# Patient Record
Sex: Female | Born: 1950 | Race: White | Hispanic: No | Smoking: Never smoker
Health system: Southern US, Community
[De-identification: ages and names within clinical notes are randomized; demographics above are authoritative.]

## PROBLEM LIST (undated history)

## (undated) DIAGNOSIS — R0681 Apnea, not elsewhere classified: Secondary | ICD-10-CM

## (undated) DIAGNOSIS — G4730 Sleep apnea, unspecified: Secondary | ICD-10-CM

## (undated) DIAGNOSIS — J45909 Unspecified asthma, uncomplicated: Secondary | ICD-10-CM

## (undated) DIAGNOSIS — T753XXA Motion sickness, initial encounter: Secondary | ICD-10-CM

## (undated) DIAGNOSIS — R079 Chest pain, unspecified: Secondary | ICD-10-CM

## (undated) DIAGNOSIS — A6000 Herpesviral infection of urogenital system, unspecified: Secondary | ICD-10-CM

## (undated) DIAGNOSIS — I5189 Other ill-defined heart diseases: Secondary | ICD-10-CM

## (undated) DIAGNOSIS — Z9289 Personal history of other medical treatment: Secondary | ICD-10-CM

## (undated) DIAGNOSIS — E119 Type 2 diabetes mellitus without complications: Secondary | ICD-10-CM

## (undated) DIAGNOSIS — K219 Gastro-esophageal reflux disease without esophagitis: Secondary | ICD-10-CM

## (undated) DIAGNOSIS — N200 Calculus of kidney: Secondary | ICD-10-CM

## (undated) DIAGNOSIS — M1990 Unspecified osteoarthritis, unspecified site: Secondary | ICD-10-CM

## (undated) DIAGNOSIS — Z803 Family history of malignant neoplasm of breast: Secondary | ICD-10-CM

## (undated) DIAGNOSIS — I10 Essential (primary) hypertension: Secondary | ICD-10-CM

## (undated) DIAGNOSIS — H3530 Unspecified macular degeneration: Secondary | ICD-10-CM

## (undated) HISTORY — DX: Personal history of other medical treatment: Z92.89

## (undated) HISTORY — DX: Unspecified asthma, uncomplicated: J45.909

## (undated) HISTORY — DX: Family history of malignant neoplasm of breast: Z80.3

## (undated) HISTORY — DX: Other ill-defined heart diseases: I51.89

## (undated) HISTORY — DX: Calculus of kidney: N20.0

## (undated) HISTORY — DX: Apnea, not elsewhere classified: R06.81

## (undated) HISTORY — DX: Chest pain, unspecified: R07.9

## (undated) HISTORY — DX: Unspecified macular degeneration: H35.30

## (undated) HISTORY — DX: Herpesviral infection of urogenital system, unspecified: A60.00

## (undated) HISTORY — PX: CARPAL TUNNEL RELEASE: SHX101

---

## 1991-04-12 HISTORY — PX: LAPAROSCOPY: SHX197

## 1999-12-20 ENCOUNTER — Ambulatory Visit (HOSPITAL_BASED_OUTPATIENT_CLINIC_OR_DEPARTMENT_OTHER): Admission: RE | Admit: 1999-12-20 | Discharge: 1999-12-20 | Payer: Self-pay | Admitting: Family Medicine

## 2000-04-11 HISTORY — PX: CHOLECYSTECTOMY, LAPAROSCOPIC: SHX56

## 2002-04-11 HISTORY — PX: HYSTEROSCOPY: SHX211

## 2004-01-19 ENCOUNTER — Ambulatory Visit: Payer: Self-pay | Admitting: Internal Medicine

## 2004-01-29 ENCOUNTER — Ambulatory Visit: Payer: Self-pay | Admitting: Internal Medicine

## 2004-03-02 ENCOUNTER — Ambulatory Visit: Payer: Self-pay | Admitting: Internal Medicine

## 2004-04-07 ENCOUNTER — Ambulatory Visit: Payer: Self-pay

## 2004-06-17 ENCOUNTER — Ambulatory Visit: Payer: Self-pay | Admitting: Unknown Physician Specialty

## 2004-09-08 ENCOUNTER — Emergency Department: Payer: Self-pay | Admitting: Emergency Medicine

## 2005-07-28 ENCOUNTER — Ambulatory Visit: Payer: Self-pay | Admitting: Unknown Physician Specialty

## 2006-04-11 HISTORY — PX: HYSTEROSCOPY: SHX211

## 2006-04-15 ENCOUNTER — Ambulatory Visit: Payer: Self-pay | Admitting: Family Medicine

## 2006-08-03 ENCOUNTER — Ambulatory Visit: Payer: Self-pay | Admitting: Unknown Physician Specialty

## 2006-09-14 ENCOUNTER — Other Ambulatory Visit: Payer: Self-pay

## 2006-09-14 ENCOUNTER — Ambulatory Visit: Payer: Self-pay | Admitting: Unknown Physician Specialty

## 2006-09-19 ENCOUNTER — Ambulatory Visit: Payer: Self-pay | Admitting: Unknown Physician Specialty

## 2007-04-16 ENCOUNTER — Inpatient Hospital Stay: Payer: Self-pay | Admitting: Specialist

## 2007-04-16 ENCOUNTER — Other Ambulatory Visit: Payer: Self-pay

## 2007-04-18 ENCOUNTER — Encounter: Payer: Self-pay | Admitting: Cardiovascular Disease

## 2007-05-13 HISTORY — PX: CARDIAC CATHETERIZATION: SHX172

## 2007-08-06 ENCOUNTER — Ambulatory Visit: Payer: Self-pay | Admitting: Unknown Physician Specialty

## 2008-06-04 IMAGING — CR DG CHEST 1V PORT
1 series · 1 of 1 positions shown · non-contrast
Comparison: none

REASON FOR EXAM: Chest pain
COMMENTS:

[view not recorded]
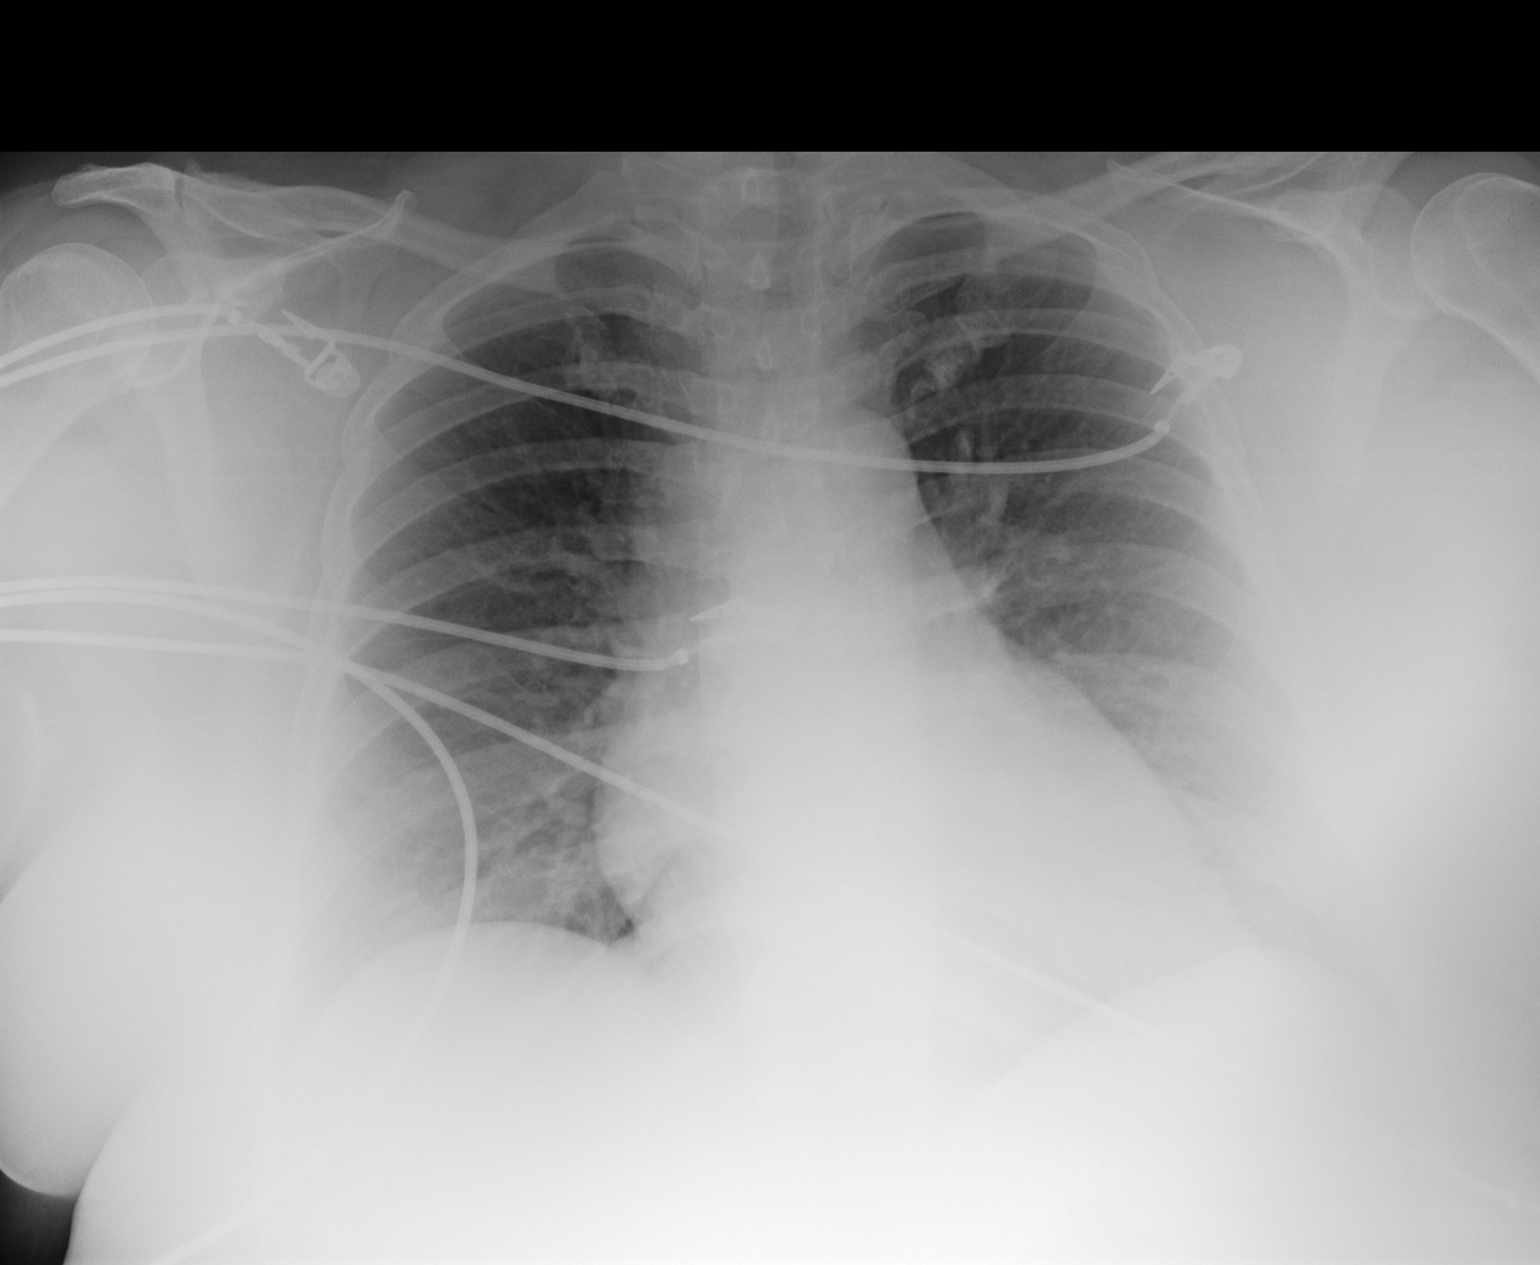

[1 of 1 positions shown; findings below may reference images not displayed]

PROCEDURE:     DXR - DXR PORTABLE CHEST SINGLE VIEW  - April 16, 2007  [DATE]

RESULT:     The lung fields are clear. No pneumonia, pneumothorax or pleural
effusion is seen. There is slight prominence of the pulmonary vascularity.
Mild pulmonary vascular congestion cannot be excluded. Monitoring electrodes
are present.
IMPRESSION: 1.  The lung fields are clear of infiltrate.
2.  There is slight prominence of the pulmonary vascularity suspicious for
mild pulmonary vascular congestion but with no interstitial or pulmonary
edema seen at this time.

## 2008-08-25 ENCOUNTER — Ambulatory Visit: Payer: Self-pay | Admitting: Family Medicine

## 2008-10-02 ENCOUNTER — Ambulatory Visit: Payer: Self-pay | Admitting: Unknown Physician Specialty

## 2009-10-05 ENCOUNTER — Ambulatory Visit: Payer: Self-pay | Admitting: Unknown Physician Specialty

## 2009-10-18 ENCOUNTER — Ambulatory Visit: Payer: Self-pay | Admitting: Family Medicine

## 2010-02-16 ENCOUNTER — Ambulatory Visit: Payer: Self-pay | Admitting: Urology

## 2010-04-30 ENCOUNTER — Ambulatory Visit: Payer: Self-pay

## 2010-11-11 ENCOUNTER — Ambulatory Visit: Payer: Self-pay | Admitting: Unknown Physician Specialty

## 2010-11-23 ENCOUNTER — Ambulatory Visit: Payer: Self-pay | Admitting: Urology

## 2011-01-12 ENCOUNTER — Ambulatory Visit: Payer: Self-pay | Admitting: Urology

## 2011-01-12 DIAGNOSIS — I10 Essential (primary) hypertension: Secondary | ICD-10-CM

## 2011-01-17 ENCOUNTER — Observation Stay: Payer: Self-pay | Admitting: Urology

## 2011-01-19 ENCOUNTER — Ambulatory Visit: Payer: Self-pay | Admitting: Urology

## 2011-01-20 ENCOUNTER — Ambulatory Visit: Payer: Self-pay | Admitting: Urology

## 2011-01-26 ENCOUNTER — Ambulatory Visit: Payer: Self-pay | Admitting: Urology

## 2011-02-03 ENCOUNTER — Ambulatory Visit: Payer: Self-pay | Admitting: Urology

## 2011-04-19 ENCOUNTER — Ambulatory Visit: Payer: Self-pay

## 2011-08-04 ENCOUNTER — Ambulatory Visit: Payer: Self-pay

## 2011-11-17 ENCOUNTER — Ambulatory Visit: Payer: Self-pay | Admitting: Obstetrics and Gynecology

## 2012-01-29 ENCOUNTER — Ambulatory Visit: Payer: Self-pay | Admitting: Internal Medicine

## 2013-04-17 ENCOUNTER — Ambulatory Visit: Payer: Self-pay | Admitting: Family Medicine

## 2013-04-24 ENCOUNTER — Ambulatory Visit: Payer: Self-pay | Admitting: Obstetrics and Gynecology

## 2013-04-30 ENCOUNTER — Ambulatory Visit: Payer: Self-pay | Admitting: Family Medicine

## 2013-11-01 DIAGNOSIS — G4730 Sleep apnea, unspecified: Secondary | ICD-10-CM | POA: Insufficient documentation

## 2013-11-01 DIAGNOSIS — E1169 Type 2 diabetes mellitus with other specified complication: Secondary | ICD-10-CM | POA: Insufficient documentation

## 2013-11-21 DIAGNOSIS — E1159 Type 2 diabetes mellitus with other circulatory complications: Secondary | ICD-10-CM | POA: Insufficient documentation

## 2013-12-30 ENCOUNTER — Ambulatory Visit: Payer: Self-pay | Admitting: Physician Assistant

## 2013-12-30 LAB — CBC WITH DIFFERENTIAL/PLATELET
Basophil #: 0.1 10*3/uL (ref 0.0–0.1)
Basophil %: 0.8 %
EOS ABS: 0.1 10*3/uL (ref 0.0–0.7)
Eosinophil %: 1.4 %
HCT: 42.1 % (ref 35.0–47.0)
HGB: 13.4 g/dL (ref 12.0–16.0)
Lymphocyte #: 3.1 10*3/uL (ref 1.0–3.6)
Lymphocyte %: 33.5 %
MCH: 27 pg (ref 26.0–34.0)
MCHC: 31.7 g/dL — AB (ref 32.0–36.0)
MCV: 85 fL (ref 80–100)
Monocyte #: 0.8 x10 3/mm (ref 0.2–0.9)
Monocyte %: 9.1 %
NEUTROS PCT: 55.2 %
Neutrophil #: 5 10*3/uL (ref 1.4–6.5)
PLATELETS: 338 10*3/uL (ref 150–440)
RBC: 4.95 10*6/uL (ref 3.80–5.20)
RDW: 13.5 % (ref 11.5–14.5)
WBC: 9.1 10*3/uL (ref 3.6–11.0)

## 2013-12-30 LAB — URINALYSIS, COMPLETE
BILIRUBIN, UR: NEGATIVE
BLOOD: NEGATIVE
Bacteria: NEGATIVE
GLUCOSE, UR: NEGATIVE
Ketone: NEGATIVE
Leukocyte Esterase: NEGATIVE
NITRITE: NEGATIVE
PH: 5.5 (ref 5.0–8.0)
Protein: NEGATIVE
Specific Gravity: 1.01 (ref 1.000–1.030)

## 2013-12-30 LAB — COMPREHENSIVE METABOLIC PANEL
ALT: 58 U/L
ANION GAP: 9 (ref 7–16)
Albumin: 3.4 g/dL (ref 3.4–5.0)
Alkaline Phosphatase: 55 U/L
BUN: 9 mg/dL (ref 7–18)
Bilirubin,Total: 0.3 mg/dL (ref 0.2–1.0)
CALCIUM: 9.6 mg/dL (ref 8.5–10.1)
CO2: 31 mmol/L (ref 21–32)
CREATININE: 0.78 mg/dL (ref 0.60–1.30)
Chloride: 99 mmol/L (ref 98–107)
EGFR (African American): >60
GLUCOSE: 170 mg/dL — AB (ref 65–99)
OSMOLALITY: 280 (ref 275–301)
Potassium: 3.7 mmol/L (ref 3.5–5.1)
SGOT(AST): 43 U/L — ABNORMAL HIGH (ref 15–37)
SODIUM: 139 mmol/L (ref 136–145)
Total Protein: 7.8 g/dL (ref 6.4–8.2)

## 2014-07-11 HISTORY — PX: ESOPHAGOGASTRODUODENOSCOPY ENDOSCOPY: SHX5814

## 2014-07-11 HISTORY — PX: COLONOSCOPY: SHX174

## 2014-07-25 ENCOUNTER — Ambulatory Visit
Admit: 2014-07-25 | Disposition: A | Discharge status: Home / Self Care | Payer: Self-pay | Attending: Gastroenterology | Admitting: Gastroenterology

## 2014-07-25 LAB — HM COLONOSCOPY

## 2015-02-13 ENCOUNTER — Other Ambulatory Visit: Payer: Self-pay | Admitting: Obstetrics and Gynecology

## 2015-03-04 ENCOUNTER — Other Ambulatory Visit: Payer: Self-pay | Admitting: Obstetrics and Gynecology

## 2015-03-04 DIAGNOSIS — Z803 Family history of malignant neoplasm of breast: Secondary | ICD-10-CM

## 2015-03-04 DIAGNOSIS — Z1231 Encounter for screening mammogram for malignant neoplasm of breast: Secondary | ICD-10-CM

## 2015-03-04 HISTORY — DX: Family history of malignant neoplasm of breast: Z80.3

## 2015-03-11 ENCOUNTER — Ambulatory Visit
Admission: RE | Admit: 2015-03-11 | Discharge: 2015-03-11 | Disposition: A | Discharge status: Home / Self Care | Payer: 59 | Source: Ambulatory Visit | Attending: Obstetrics and Gynecology | Admitting: Obstetrics and Gynecology

## 2015-03-11 DIAGNOSIS — Z1231 Encounter for screening mammogram for malignant neoplasm of breast: Secondary | ICD-10-CM | POA: Insufficient documentation

## 2015-03-11 LAB — HM MAMMOGRAPHY

## 2015-10-05 ENCOUNTER — Ambulatory Visit
Admission: EM | Admit: 2015-10-05 | Discharge: 2015-10-05 | Disposition: A | Discharge status: Home / Self Care | Payer: BLUE CROSS/BLUE SHIELD | Attending: Family Medicine | Admitting: Family Medicine

## 2015-10-05 ENCOUNTER — Encounter: Payer: Self-pay | Admitting: *Deleted

## 2015-10-05 DIAGNOSIS — T148 Other injury of unspecified body region: Secondary | ICD-10-CM

## 2015-10-05 DIAGNOSIS — W57XXXA Bitten or stung by nonvenomous insect and other nonvenomous arthropods, initial encounter: Secondary | ICD-10-CM

## 2015-10-05 DIAGNOSIS — Z23 Encounter for immunization: Secondary | ICD-10-CM

## 2015-10-05 HISTORY — DX: Essential (primary) hypertension: I10

## 2015-10-05 HISTORY — DX: Type 2 diabetes mellitus without complications: E11.9

## 2015-10-05 MED ORDER — CEPHALEXIN 500 MG PO CAPS: 500.0000 mg | ORAL_CAPSULE | Freq: Three times a day (TID) | ORAL | Status: DC

## 2015-10-05 MED ORDER — MUPIROCIN CALCIUM 2 % EX CREA: 1.0000 "application " | TOPICAL_CREAM | Freq: Three times a day (TID) | CUTANEOUS | Status: DC

## 2015-10-05 MED ORDER — TETANUS-DIPHTH-ACELL PERTUSSIS 5-2.5-18.5 LF-MCG/0.5 IM SUSP
0.5000 mL | Freq: Once | INTRAMUSCULAR | Status: AC
  Administered 2015-10-05: 0.5 mL via INTRAMUSCULAR

## 2015-10-05 NOTE — ED Notes (Signed)
Sudden onset right 2nd & 3rd toe pain while mowing today. Toes appear reddened and edematous.

## 2015-11-06 NOTE — ED Provider Notes (Addendum)
[facility]    CSN: [id number] Arrival date & time: 10/05/15  7939  First Provider Contact:  First MD Initiated Contact with Patient 10/05/15 1927        History   Chief Complaint Chief Complaint  Patient presents with  . Toe Pain    HPI RANDA FELTES is a 65 y.o. female.   65 yo female with a c/o 2nd and 3rd right toe pain, redness, and swelling after mowing in yard. Denies any fevers, chills.    The history is provided by the patient.  Toe Pain     Past Medical History:  Diagnosis Date  . Diabetes mellitus without complication (HCC)   . Hypertension     There are no active problems to display for this patient.   History reviewed. No pertinent surgical history.  OB History    No data available       Home Medications    Prior to Admission medications   Medication Sig Start Date End Date Taking? Authorizing Provider  aspirin EC 81 MG tablet Take 81 mg by mouth daily.   Yes Historical Provider, MD  canagliflozin (INVOKANA) 100 MG TABS tablet Take by mouth daily before breakfast.   Yes Historical Provider, MD  cetirizine (ZYRTEC) 10 MG tablet Take 10 mg by mouth daily.   Yes Historical Provider, MD  Cyanocobalamin (VITAMIN B 12 PO) Take by mouth.   Yes Historical Provider, MD  FLUoxetine (PROZAC) 10 MG capsule Take 10 mg by mouth daily.   Yes Historical Provider, MD  Liraglutide (VICTOZA [place]) Inject into the skin.   Yes Historical Provider, MD  lisinopril-hydrochlorothiazide (PRINZIDE,ZESTORETIC) 10-12.5 MG tablet Take 1 tablet by mouth daily.   Yes Historical Provider, MD  metFORMIN (GLUMETZA) 1000 MG (MOD) 24 hr tablet Take 1,000 mg by mouth daily with breakfast.   Yes Historical Provider, MD  Multiple Vitamins-Minerals (PRESERVISION/LUTEIN PO) Take by mouth.   Yes Historical Provider, MD  omeprazole (PRILOSEC) 20 MG capsule Take 20 mg by mouth daily.   Yes Historical Provider, MD  sertraline (ZOLOFT) 50 MG tablet Take 50 mg by mouth daily.    Yes Historical Provider, MD  cephALEXin (KEFLEX) 500 MG capsule Take 1 capsule (500 mg total) by mouth 3 (three) times daily. 10/05/15   Payton Mccallum, MD  mupirocin cream (BACTROBAN) 2 % Apply 1 application topically 3 (three) times daily. 10/05/15   Payton Mccallum, MD    Family History Family History  Problem Relation Age of Onset  . Breast cancer Sister 76  . Breast cancer Cousin 60    2 pat cousins    Social History Social History  Substance Use Topics  . Smoking status: Never Smoker  . Smokeless tobacco: Not on file  . Alcohol use No     Allergies   Blue dyes (parenteral); Ciprofloxacin; and Hydrocodone   Review of Systems Review of Systems   Physical Exam Triage Vital Signs ED Triage Vitals [10/05/15 1849]  Enc Vitals Group     BP 126/68     Pulse Rate 73     Resp 16     Temp 98.6 F (37 C)     Temp Source Oral     SpO2 95 %     Weight 226 lb (102.5 kg)     Height 5\' 4"  (1.626 m)     Head Circumference      Peak Flow      Pain Score      Pain Loc  Pain Edu?      Excl. in GC?    No data found.   Updated Vital Signs BP 126/68 (BP Location: Left Arm)   Pulse 73   Temp 98.6 F (37 C) (Oral)   Resp 16   Ht  (1.626 m)   Wt 226 lb (102.5 kg)   SpO2 95%   BMI 38.79 kg/m   Visual Acuity Right Eye Distance:   Left Eye Distance:   Bilateral Distance:    Right Eye Near:   Left Eye Near:    Bilateral Near:     Physical Exam  Constitutional: She appears well-developed and well-nourished. No distress.  Skin: She is not diaphoretic. There is erythema (blanchable to 2nd and 3rd toes on the right; as well as pinpoint puncture wounds).  Nursing note and vitals reviewed.    UC Treatments / Results  Labs (all labs ordered are listed, but only abnormal results are displayed) Labs Reviewed - No data to display  EKG  EKG Interpretation None       Radiology No results found.  Procedures Procedures (including critical care  time)  Medications Ordered in UC Medications  Tdap (BOOSTRIX) injection 0.5 mL (0.5 mLs Intramuscular Given 10/05/15 1952)     Initial Impression / Assessment and Plan / UC Course  I have reviewed the triage vital signs and the nursing notes.  Pertinent labs & imaging results that were available during my care of the patient were reviewed by me and considered in my medical decision making (see chart for details).  Clinical Course      Final Clinical Impressions(s) / UC Diagnoses   Final diagnoses:  Insect bite    New Prescriptions Discharge Medication List as of 10/05/2015  7:56 PM    START taking these medications   Details  cephALEXin (KEFLEX) 500 MG capsule Take 1 capsule (500 mg total) by mouth 3 (three) times daily., Starting 10/05/2015, Until Discontinued, Normal    mupirocin cream (BACTROBAN) 2 % Apply 1 application topically 3 (three) times daily., Starting 10/05/2015, Until Discontinued, Normal       1.  diagnosis reviewed with patient 2. rx as per orders above; reviewed possible side effects, interactions, risks and benefits; patient given tdap vaccine 3. Recommend supportive treatment with warm compresses 4. Follow-up prn if symptoms worsen or don't improve   Payton Mccallum, MD 11/06/15 1330    Payton Mccallum, MD 11/06/15 1332

## 2016-01-23 ENCOUNTER — Emergency Department: Payer: BLUE CROSS/BLUE SHIELD

## 2016-01-23 ENCOUNTER — Emergency Department
Admission: EM | Admit: 2016-01-23 | Discharge: 2016-01-23 | Disposition: A | Discharge status: Home / Self Care | Payer: BLUE CROSS/BLUE SHIELD | Attending: Emergency Medicine | Admitting: Emergency Medicine

## 2016-01-23 DIAGNOSIS — I10 Essential (primary) hypertension: Secondary | ICD-10-CM | POA: Insufficient documentation

## 2016-01-23 DIAGNOSIS — R079 Chest pain, unspecified: Secondary | ICD-10-CM

## 2016-01-23 DIAGNOSIS — Z79899 Other long term (current) drug therapy: Secondary | ICD-10-CM | POA: Diagnosis not present

## 2016-01-23 DIAGNOSIS — R0789 Other chest pain: Secondary | ICD-10-CM | POA: Diagnosis not present

## 2016-01-23 DIAGNOSIS — E119 Type 2 diabetes mellitus without complications: Secondary | ICD-10-CM | POA: Insufficient documentation

## 2016-01-23 DIAGNOSIS — Z7982 Long term (current) use of aspirin: Secondary | ICD-10-CM | POA: Insufficient documentation

## 2016-01-23 LAB — CBC
HCT: 38 % (ref 35.0–47.0)
Hemoglobin: 12.7 g/dL (ref 12.0–16.0)
MCH: 23.9 pg — ABNORMAL LOW (ref 26.0–34.0)
MCHC: 33.3 g/dL (ref 32.0–36.0)
MCV: 71.7 fL — ABNORMAL LOW (ref 80.0–100.0)
PLATELETS: 306 10*3/uL (ref 150–440)
RBC: 5.3 MIL/uL — ABNORMAL HIGH (ref 3.80–5.20)
RDW: 18.8 % — AB (ref 11.5–14.5)
WBC: 7.8 10*3/uL (ref 3.6–11.0)

## 2016-01-23 LAB — BASIC METABOLIC PANEL
Anion gap: 8 (ref 5–15)
BUN: 19 mg/dL (ref 6–20)
CO2: 28 mmol/L (ref 22–32)
CREATININE: 0.64 mg/dL (ref 0.44–1.00)
Calcium: 8.9 mg/dL (ref 8.9–10.3)
Chloride: 102 mmol/L (ref 101–111)
GFR calc Af Amer: >60 mL/min (ref >60)
GLUCOSE: 175 mg/dL — AB (ref 65–99)
Potassium: 3.9 mmol/L (ref 3.5–5.1)
SODIUM: 138 mmol/L (ref 135–145)

## 2016-01-23 LAB — TROPONIN I: Troponin I: <0.03 ng/mL (ref <0.03)

## 2016-01-23 MED ORDER — ASPIRIN 81 MG PO CHEW
324.0000 mg | CHEWABLE_TABLET | Freq: Once | ORAL | Status: AC
  Administered 2016-01-23: 324 mg via ORAL
  Filled 2016-01-23: qty 4

## 2016-01-23 NOTE — ED Triage Notes (Signed)
Pt arrives to ED via POV for c/o sub-sternal chest pain with radiation "between the shoulder blades" since 330 am. Pt denies N/V, denies SOB, weakness, diaphoresis, or dizziness. Pt reports cardiac h/x and previous hospital admission for chest pain "but they never found anything, except my heart looked good and there were no blockages. I remember that".

## 2016-01-23 NOTE — ED Provider Notes (Signed)
[facility] Emergency Department Provider Note  ____________________________________________   First MD Initiated Contact with Patient 01/23/16 [phone]     (approximate)  I have reviewed the triage vital signs and the nursing notes.   HISTORY  Chief Complaint Chest Pain    HPI Miranda Pollard is a 65 y.o. female his medical history includes morbid obesity, hypertension, and diabetes who presents for evaluation of acute onset severe left-sided chest pain dating radiating through to the back.  She reports that she was already up at 3:30 in the morning caring for her elderly parentswhen she suddenly developed pain.  It is better now but it was severe at the time and she describes it as sharp and stabbing as well as a faint pressure.  She denies shortness of breath, nausea, vomiting, abdominal pain, dysuria, fever/chills.  Nothing in particular made it better nor worse.  She reports that 5 or 6 years ago she was admitted for similar chest pain and had a cardiac catheterization which was unremarkable.  They told her at the time she had no blockages.  She saw Dr. Darrold Junker after her hospitalization but has not followed up with them since then.  She does have a regular primary care doctor.  She has never smoked tobacco and has no first-degree relatives who have had a heart attack.   Past Medical History:  Diagnosis Date  . Diabetes mellitus without complication (HCC)   . Hypertension     There are no active problems to display for this patient.   History reviewed. No pertinent surgical history.  Prior to Admission medications   Medication Sig Start Date End Date Taking? Authorizing Provider  aspirin EC 81 MG tablet Take 81 mg by mouth daily.   Yes Historical Provider, MD  canagliflozin (INVOKANA) 100 MG TABS tablet Take by mouth daily before breakfast.   Yes Historical Provider, MD  cetirizine (ZYRTEC) 10 MG tablet Take 10 mg by mouth daily.   Yes Historical  Provider, MD  Cyanocobalamin (VITAMIN B 12 PO) Take by mouth.   Yes Historical Provider, MD  Liraglutide (VICTOZA [place]) Inject into the skin.   Yes Historical Provider, MD  lisinopril-hydrochlorothiazide (PRINZIDE,ZESTORETIC) 10-12.5 MG tablet Take 1 tablet by mouth daily.   Yes Historical Provider, MD  Multiple Vitamins-Minerals (PRESERVISION/LUTEIN PO) Take by mouth.   Yes Historical Provider, MD  omeprazole (PRILOSEC) 20 MG capsule Take 20 mg by mouth daily.   Yes Historical Provider, MD  sertraline (ZOLOFT) 50 MG tablet Take 50 mg by mouth daily.   Yes Historical Provider, MD  albuterol (PROVENTIL HFA;VENTOLIN HFA) 108 (90 Base) MCG/ACT inhaler Inhale 2 puffs into the lungs every 6 (six) hours as needed for wheezing or shortness of breath.    Historical Provider, MD  cephALEXin (KEFLEX) 500 MG capsule Take 1 capsule (500 mg total) by mouth 3 (three) times daily. 10/05/15   Payton Mccallum, MD  FLUoxetine (PROZAC) 10 MG capsule Take 10 mg by mouth daily.    Historical Provider, MD  metFORMIN (GLUMETZA) 1000 MG (MOD) 24 hr tablet Take 1,000 mg by mouth daily with breakfast.    Historical Provider, MD  mupirocin cream (BACTROBAN) 2 % Apply 1 application topically 3 (three) times daily. 10/05/15   Payton Mccallum, MD    Allergies Blue dyes (parenteral); Ciprofloxacin; and Hydrocodone  Family History  Problem Relation Age of Onset  . Breast cancer Sister 39  . Breast cancer Cousin 60    2 pat cousins  Social History Social History  Substance Use Topics  . Smoking status: Never Smoker  . Smokeless tobacco: Never Used  . Alcohol use No    Review of Systems Constitutional: No fever/chills Eyes: No visual changes. ENT: No sore throat. Cardiovascular: +chest pain. Respiratory: Denies shortness of breath. Gastrointestinal: No abdominal pain.  No nausea, no vomiting.  No diarrhea.  No constipation. Genitourinary: Negative for dysuria. Musculoskeletal: Negative for back pain. Skin: Negative  for rash. Neurological: Negative for headaches, focal weakness or numbness.  10-point ROS otherwise negative.  ____________________________________________   PHYSICAL EXAM:  VITAL SIGNS: ED Triage Vitals  Enc Vitals Group     BP 01/23/16 0557 130/64     Pulse Rate 01/23/16 0557 74     Resp 01/23/16 0557 16     Temp 01/23/16 0557 98.5 F (36.9 C)     Temp Source 01/23/16 0557 Oral     SpO2 01/23/16 0557 96 %     Weight 01/23/16 0558 226 lb (102.5 kg)     Height 01/23/16 0558 5\' 4"  (1.626 m)     Head Circumference --      Peak Flow --      Pain Score 01/23/16 0559 10     Pain Loc --      Pain Edu? --      Excl. in GC? --     Constitutional: Alert and oriented. Well appearing and in no acute distress. Eyes: Conjunctivae are normal. PERRL. EOMI. Head: Atraumatic. Nose: No congestion/rhinnorhea. Mouth/Throat: Mucous membranes are moist.  Oropharynx non-erythematous. Neck: No stridor.  No meningeal signs.   Cardiovascular: Normal rate, regular rhythm. Good peripheral circulation. Grossly normal heart sounds. Respiratory: Normal respiratory effort.  No retractions. Lungs CTAB. Gastrointestinal: Morbid obesity.  Soft and nontender. No distention.  Musculoskeletal: No lower extremity tenderness nor edema. No gross deformities of extremities. Neurologic:  Normal speech and language. No gross focal neurologic deficits are appreciated.  Skin:  Skin is warm, dry and intact. No rash noted. Psychiatric: Mood and affect are normal. Speech and behavior are normal.  ____________________________________________   LABS (all labs ordered are listed, but only abnormal results are displayed)  Labs Reviewed  BASIC METABOLIC PANEL - Abnormal; Notable for the following:       Result Value   Glucose, Bld 175 (*)    All other components within normal limits  CBC - Abnormal; Notable for the following:    RBC 5.30 (*)    MCV 71.7 (*)    MCH 23.9 (*)    RDW 18.8 (*)    All other  components within normal limits  TROPONIN I  TROPONIN I   ____________________________________________  EKG  ED ECG REPORT I, Nalanie Winiecki, the attending physician, personally viewed and interpreted this ECG.  Date: 01/23/2016 EKG Time: 5:56 AM Rate: 70 Rhythm: normal sinus rhythm QRS Axis: normal Intervals: normal ST/T Wave abnormalities: normal Conduction Disturbances: none Narrative Interpretation: unremarkable  ____________________________________________  RADIOLOGY   Dg Chest 2 View  Result Date: 01/23/2016 CLINICAL DATA:  Substernal chest pain. EXAM: CHEST  2 VIEW COMPARISON:  04/16/2007 FINDINGS: The cardiomediastinal contours are normal. The lungs are clear. Pulmonary vasculature is normal. No consolidation, pleural effusion, or pneumothorax. No acute osseous abnormalities are seen. Degenerative change in the thoracic spine. IMPRESSION: No active cardiopulmonary disease. Electronically Signed   By: Rubye OaksMelanie  Ehinger M.D.   On: 01/23/2016 06:24    ____________________________________________   PROCEDURES  Procedure(s) performed:   Procedures   Critical Care  performed: No ____________________________________________   INITIAL IMPRESSION / ASSESSMENT AND PLAN / ED COURSE  Pertinent labs & imaging results that were available during my care of the patient were reviewed by me and considered in my medical decision making (see chart for details).  I ordered a full dose aspirin for the patient and she takes a regular daily baby aspirin.  She has a negative first troponin and a HEART score of 3 which is low risk.  It is also the weekend and I did tell the patient and her husband that no advanced testing will take place in the hospital.  She has inability to follow-up with cardiology within the next couple of days.  We will check a second troponin and if the troponin is negative then she is comfortable with plan to follow up as an outpatient.   Clinical Course    Comment By Time  Transferring ED care to Dr. Fanny Bien to follow up second troponin and reassess the patient. Loleta Rose, MD 10/14 [phone]    ____________________________________________  FINAL CLINICAL IMPRESSION(S) / ED DIAGNOSES  Final diagnoses:  Chest pain, unspecified type     MEDICATIONS GIVEN DURING THIS VISIT:  Medications  aspirin chewable tablet 324 mg (324 mg Oral Given 01/23/16 0643)     NEW OUTPATIENT MEDICATIONS STARTED DURING THIS VISIT:  Discharge Medication List as of 01/23/2016  9:50 AM      Discharge Medication List as of 01/23/2016  9:50 AM      Discharge Medication List as of 01/23/2016  9:50 AM       Note:  This document was prepared using Dragon voice recognition software and may include unintentional dictation errors.    Loleta Rose, MD 01/24/16 [phone]

## 2016-01-23 NOTE — Discharge Instructions (Signed)

## 2016-01-23 NOTE — ED Provider Notes (Signed)
Patient resting comfortable. Awake and alert and very pleasant. She is eating, reports no distress or ongoing chest discomfort. She understands careful return precautions and a plan of follow-up closely with Dr. Cassie FreerParachos on Monday.  2 troponins normal. Patient being discharged home  Return precautions and treatment recommendations and follow-up discussed with the patient who is agreeable with the plan.    Sharyn CreamerMark Quale, MD 01/23/16 [phone]

## 2016-03-29 ENCOUNTER — Other Ambulatory Visit: Payer: Self-pay | Admitting: Obstetrics and Gynecology

## 2016-05-02 ENCOUNTER — Encounter: Payer: Self-pay | Admitting: Obstetrics and Gynecology

## 2016-05-02 DIAGNOSIS — Z9289 Personal history of other medical treatment: Secondary | ICD-10-CM

## 2016-05-02 HISTORY — DX: Personal history of other medical treatment: Z92.89

## 2016-05-03 LAB — HM PAP SMEAR: HM Pap smear: NEGATIVE

## 2016-05-06 ENCOUNTER — Other Ambulatory Visit: Payer: Self-pay | Admitting: Obstetrics and Gynecology

## 2016-05-06 DIAGNOSIS — Z1231 Encounter for screening mammogram for malignant neoplasm of breast: Secondary | ICD-10-CM

## 2016-05-19 ENCOUNTER — Ambulatory Visit: Admission: RE | Admit: 2016-05-19 | Payer: BLUE CROSS/BLUE SHIELD | Source: Ambulatory Visit

## 2016-05-24 ENCOUNTER — Ambulatory Visit: Payer: BLUE CROSS/BLUE SHIELD | Attending: Obstetrics and Gynecology

## 2016-07-05 ENCOUNTER — Telehealth: Payer: Self-pay

## 2016-07-05 ENCOUNTER — Ambulatory Visit
Admission: RE | Admit: 2016-07-05 | Discharge: 2016-07-05 | Disposition: A | Discharge status: Home / Self Care | Payer: BLUE CROSS/BLUE SHIELD | Source: Ambulatory Visit | Attending: Obstetrics and Gynecology | Admitting: Obstetrics and Gynecology

## 2016-07-05 DIAGNOSIS — Z1231 Encounter for screening mammogram for malignant neoplasm of breast: Secondary | ICD-10-CM | POA: Diagnosis present

## 2016-07-05 NOTE — Telephone Encounter (Signed)
Pt no showed for her Myriad results in Feb. Please call and schedule this with SDJ at first available. Thank you.

## 2016-07-05 NOTE — Telephone Encounter (Signed)
Left voicemail for pt tocall back to be reschedule.

## 2016-07-07 NOTE — Telephone Encounter (Signed)
I called and Left message with Family to have patient call to be schedule.

## 2016-07-26 NOTE — Telephone Encounter (Signed)
Spoke with Miranda Pollard and schedule May 18 at 8:50 with Jean Rosenthal

## 2016-08-26 ENCOUNTER — Ambulatory Visit: Payer: Self-pay | Admitting: Obstetrics and Gynecology

## 2016-09-07 ENCOUNTER — Encounter: Payer: Self-pay | Admitting: Obstetrics and Gynecology

## 2016-09-07 ENCOUNTER — Ambulatory Visit (INDEPENDENT_AMBULATORY_CARE_PROVIDER_SITE_OTHER): Payer: BLUE CROSS/BLUE SHIELD | Admitting: Obstetrics and Gynecology

## 2016-09-07 DIAGNOSIS — Z803 Family history of malignant neoplasm of breast: Secondary | ICD-10-CM | POA: Diagnosis not present

## 2016-09-07 NOTE — Progress Notes (Signed)
Obstetrics & Gynecology Office Visit   Chief Complaint  Patient presents with  . Follow-up    Myriad Results    History of Present Illness: She is a 66 y.o. female , G1P0010 , for follow up from genetic testing. She had testing for [facility].   Patient had the BRCA 1&2 testing done, which was normal., Colaris testing done, which was normal., and My Risk Her Breast Cancer RiskSCore was 7.3% lifetime risk of developing breast cancer.  Tyrer Cuzik was 12.2% lifetime risk of developing breast cancer.    Review of Systems: ROS - negative  Past Medical History:  Past Medical History:  Diagnosis Date  . Apnea   . Asthma   . Calculus of kidney   . Diabetes mellitus without complication ([place])   . Family history of breast cancer 03/04/2015   MyRisk NEG  . Genital herpes   . History of Papanicolaou smear of cervix 05/02/2016   NIL/NEG  . Hypertension   . Hypertension   . Macular degeneration (senile) of retina     Past Surgical History:  Past Surgical History:  Procedure Laterality Date  . CARPAL TUNNEL RELEASE  1980's  . CHOLECYSTECTOMY, LAPAROSCOPIC  2002  . COLONOSCOPY  07/2014  . ESOPHAGOGASTRODUODENOSCOPY ENDOSCOPY  07/2014  . HYSTEROSCOPY  2004   cystic hyperplasia  . HYSTEROSCOPY  2008   postemopausa bleeding weakly proliferative endometrium on pathology  . LAPAROSCOPY  1993   Laparoscopy and laparotomy for "ovarian cysts and fibroid"    Gynecologic History: No LMP recorded. Patient is postmenopausal.  Obstetric History: G1P0010  Family History:  Family History  Problem Relation Age of Onset  . Breast cancer Sister 57  . Breast cancer Cousin 60       2 pat cousins  . Hypertension Mother   . Melanoma Mother 90  . Asthma Father   . Congestive Heart Failure Father   . COPD Father   . Diabetes Father   . Emphysema Father   . Hypertension Brother   . Sleep apnea Brother   . Colon cancer Maternal Grandmother     Social History:  Social History   Social  History  . Marital status: Married    Spouse name: N/A  . Number of children: N/A  . Years of education: N/A   Occupational History  . Not on file.   Social History Main Topics  . Smoking status: Never Smoker  . Smokeless tobacco: Never Used  . Alcohol use No  . Drug use: No  . Sexual activity: Not Currently    Birth control/ protection: Post-menopausal   Other Topics Concern  . Not on file   Social History Narrative  . No narrative on file    Allergies:  Allergies  Allergen Reactions  . Blue Dyes (Parenteral) Anaphylaxis  . Ciprofloxacin Nausea And Vomiting  . Hydrocodone Palpitations    Medications: Prior to Admission medications   Medication Sig Start Date End Date Taking? Authorizing Provider  acetaminophen (TYLENOL) 650 MG CR tablet Take by mouth.   Yes [provider]  albuterol (PROVENTIL HFA;VENTOLIN HFA) 108 (90 Base) MCG/ACT inhaler Inhale 2 puffs into the lungs every 6 (six) hours as needed for wheezing or shortness of breath.   Yes [provider]  aspirin EC 81 MG tablet Take 81 mg by mouth daily.   Yes [provider]  bevacizumab (AVASTIN) 1.25 mg/0.1 mL SOLN Inject into the vein.   Yes [provider]  canagliflozin (INVOKANA) 100 MG TABS  tablet Take by mouth daily before breakfast.   Yes [provider]  cetirizine (ZYRTEC) 10 MG tablet Take 10 mg by mouth daily.   Yes [provider]  Cyanocobalamin (VITAMIN B 12 PO) Take by mouth.   Yes [provider]  FLUoxetine (PROZAC) 10 MG capsule Take 10 mg by mouth daily.   Yes [provider]  insulin lispro (HUMALOG KWIKPEN) 100 UNIT/ML KiwkPen Humalog KwikPen (U-100) Insulin 100 unit/mL subcutaneous   3 units 3 times a day by sub-q route.   Yes [provider]  Insulin Pen Needle (FIFTY50 PEN NEEDLES) 32G X 4 MM MISC Use 1 each once daily. 08/10/16  Yes [provider]  liraglutide (VICTOZA) 18 MG/3ML SOPN Victoza 0.6  mg/0.1 mL (18 mg/3 mL) subcutaneous pen injector   1.8 mL every day by sub-q route.   Yes [provider]  lisinopril-hydrochlorothiazide (PRINZIDE,ZESTORETIC) 10-12.5 MG tablet Take 1 tablet by mouth daily.   Yes [provider]  metFORMIN (GLUMETZA) 1000 MG (MOD) 24 hr tablet Take 1,000 mg by mouth daily with breakfast.   Yes [provider]  Multiple Vitamins-Minerals (ICAPS) CAPS Take by mouth.   Yes [provider]  Multiple Vitamins-Minerals (PRESERVISION/LUTEIN PO) Take by mouth.   Yes [provider]  mupirocin cream (BACTROBAN) 2 % Apply 1 application topically 3 (three) times daily. 10/05/15  Yes Norval Gable, MD  omeprazole (PRILOSEC) 20 MG capsule Take 20 mg by mouth daily.   Yes [provider]  omeprazole (PRILOSEC) 20 MG capsule omeprazole 20 mg tablet,delayed release   1 tablet every day by oral route.   Yes [provider]  sertraline (ZOLOFT) 50 MG tablet Take 50 mg by mouth daily.   Yes [provider]  cephALEXin (KEFLEX) 500 MG capsule Take 1 capsule (500 mg total) by mouth 3 (three) times daily. Patient not taking: Reported on 09/07/2016 10/05/15   Norval Gable, MD  FLUoxetine (PROZAC) 10 MG capsule fluoxetine 10 mg capsule   1 capsule every day by oral route.    [provider]    Physical Exam BP 134/84   Ht '5\' 5"'$  (1.651 m)   Wt 234 lb (106.1 kg)   BMI 38.94 kg/m  No LMP recorded. Patient is postmenopausal. Physical Exam  NAD   Assessment: 66 y.o. G1P0010 here for genetic screening results with family history of breast cancer  Plan: Patient is reassured by these results, and is counseled that this does not reduce her risk to zero. She understands that there is still a baseline risk for cancer of the breast (1:8 if lives beyond age 28) and ovary (which is less than one percent for the general population). She understands that continued surveillance of symptoms and regular exams and  mammograms are recommended.   She understands that continued surveillance of symptoms and regular examinations and colon screening is recommended.   Patient elects to accept the reassurance given today and continue with routine surveillance at this time.  She understands that I will arrange a discussion with a genetic counselor, if she has any further questions.  30 minutes spent in face to face discussion with > 50% spent in counseling and management of her genetic test results for her family history of  Breast cancer.   Prentice Docker, MD 09/07/2016 1:05 PM

## 2016-09-20 ENCOUNTER — Ambulatory Visit
Admission: EM | Admit: 2016-09-20 | Discharge: 2016-09-20 | Disposition: A | Discharge status: Home / Self Care | Payer: BLUE CROSS/BLUE SHIELD

## 2016-09-20 ENCOUNTER — Ambulatory Visit (INDEPENDENT_AMBULATORY_CARE_PROVIDER_SITE_OTHER): Payer: BLUE CROSS/BLUE SHIELD

## 2016-09-20 DIAGNOSIS — S46912A Strain of unspecified muscle, fascia and tendon at shoulder and upper arm level, left arm, initial encounter: Secondary | ICD-10-CM | POA: Diagnosis not present

## 2016-09-20 MED ORDER — CYCLOBENZAPRINE HCL 10 MG PO TABS: 10.0000 mg | ORAL_TABLET | Freq: Every day | ORAL | 0 refills | Status: DC

## 2016-09-20 MED ORDER — TRAMADOL HCL 50 MG PO TABS: ORAL_TABLET | ORAL | 0 refills | Status: DC
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
## 2016-09-20 NOTE — Discharge Instructions (Signed)
Rest, ibuprofen, tylenol as needed

## 2016-11-05 ENCOUNTER — Ambulatory Visit
Admission: EM | Admit: 2016-11-05 | Discharge: 2016-11-05 | Disposition: A | Discharge status: Home / Self Care | Payer: BLUE CROSS/BLUE SHIELD | Attending: Family | Admitting: Family

## 2016-11-05 ENCOUNTER — Encounter: Payer: Self-pay | Admitting: Gynecology

## 2016-11-05 DIAGNOSIS — L03031 Cellulitis of right toe: Secondary | ICD-10-CM

## 2016-11-05 DIAGNOSIS — W208XXA Other cause of strike by thrown, projected or falling object, initial encounter: Secondary | ICD-10-CM | POA: Diagnosis not present

## 2016-11-05 DIAGNOSIS — M79674 Pain in right toe(s): Secondary | ICD-10-CM | POA: Diagnosis not present

## 2016-11-05 MED ORDER — MUPIROCIN 2 % EX OINT: 1.0000 "application " | TOPICAL_OINTMENT | Freq: Two times a day (BID) | CUTANEOUS | 0 refills | Status: DC

## 2016-11-05 NOTE — Discharge Instructions (Signed)
Epson soaks. Since you are diabetic, please keep your blood sugars under good control and stay very vigilant regarding signs of infection.   Start antibiotic ointment.   Please follow up with her doctor or podiatry if symptoms persist and certainly come back for reevaluation if signs of infection  ( redness, pain, drainage) worsen over the weekend.

## 2016-11-05 NOTE — ED Provider Notes (Signed)
CSN: [id number]     Arrival date & time 11/05/16  [id number] History   First MD Initiated Contact with Patient 11/05/16 1005     Chief Complaint  Patient presents with  . Foot Pain   (Consider location/radiation/quality/duration/timing/severity/associated sxs/prior Treatment) Chief complaint right toe pain after dropping soda can on it 5 days, waxing and waning.  Thought it was getting better this week and then husband touched toe last night, noticed pain, and thought redness had gotten worse.   Describes it as red, painful, throbbing. No purulent discharge, fever, chills, N, vomiting.  Tylenol arthritis with some relief.   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 Apnea   . Asthma   . Calculus of kidney   . Diabetes mellitus without complication (HCC)   . Family history of breast cancer 03/04/2015   MyRisk NEG  . Genital herpes   . History of Papanicolaou smear of cervix 05/02/2016   NIL/NEG  . Hypertension   . Hypertension   . Macular degeneration (senile) of retina    Past Surgical History:  Procedure Laterality Date  . CARPAL TUNNEL RELEASE  1980's  . CHOLECYSTECTOMY, LAPAROSCOPIC  2002  . COLONOSCOPY  07/2014  . ESOPHAGOGASTRODUODENOSCOPY ENDOSCOPY  07/2014  . HYSTEROSCOPY  2004   cystic hyperplasia  . HYSTEROSCOPY  2008   postemopausa bleeding weakly proliferative endometrium on pathology  . LAPAROSCOPY  1993   Laparoscopy and laparotomy for "ovarian cysts and fibroid"   Family History  Problem Relation Age of Onset  . Breast cancer Sister 4739  . Breast cancer Cousin 60       2 pat cousins  . Hypertension Mother   . Melanoma Mother 1178  . Asthma Father   . Congestive Heart Failure Father   . COPD Father   . Diabetes Father   . Emphysema Father   . Hypertension Brother   . Sleep apnea Brother    . Colon cancer Maternal Grandmother    Social History  Substance Use Topics  . Smoking status: Never Smoker  . Smokeless tobacco: Never Used  . Alcohol use No   OB History    Gravida Para Term Preterm AB Living   1       1     SAB TAB Ectopic Multiple Live Births   1       0     Review of Systems  Constitutional: Negative for chills and fever.  Respiratory: Negative for cough.   Cardiovascular: Negative for chest pain and palpitations.  Gastrointestinal: Negative for nausea and vomiting.  Musculoskeletal: Negative for joint swelling.  Skin: Positive for wound.    Allergies  Blue dyes (parenteral); Ciprofloxacin; and Hydrocodone  Home Medications   Prior to Admission medications   Medication Sig Start Date End Date Taking? Authorizing Provider  acetaminophen (TYLENOL) 650 MG CR tablet Take by mouth.   Yes [provider]  albuterol (PROVENTIL HFA;VENTOLIN HFA) 108 (90 Base) MCG/ACT inhaler Inhale 2 puffs into the lungs every 6 (six) hours as needed for wheezing or shortness of breath.   Yes [provider]  aspirin 81 MG chewable tablet Chew by mouth daily.   Yes [provider]  aspirin EC 81 MG tablet Take 81 mg by mouth daily.   Yes [provider]  bevacizumab (AVASTIN) 1.25 mg/0.1 mL SOLN Inject into the vein.   Yes [provider]  canagliflozin (INVOKANA) 100 MG TABS tablet Take by mouth daily before breakfast.   Yes [provider]  cetirizine (ZYRTEC) 10 MG tablet Take 10 mg by mouth daily.   Yes [provider]  Cyanocobalamin (VITAMIN B 12 PO) Take by mouth.   Yes [provider]  cyclobenzaprine (FLEXERIL) 10 MG tablet Take 1 tablet (10 mg total) by mouth at bedtime. 09/20/16  Yes Payton Mccallumonty, [place], MD  liraglutide (VICTOZA) 18 MG/3ML SOPN Victoza 0.6 mg/0.1 mL (18 mg/3 mL) subcutaneous pen injector   1.8 mL every day by sub-q route.   Yes [provider]  lisinopril-hydrochlorothiazide  (PRINZIDE,ZESTORETIC) 10-12.5 MG tablet Take 1 tablet by mouth daily.   Yes [provider]  metFORMIN (GLUMETZA) 1000 MG (MOD) 24 hr tablet Take 1,000 mg by mouth daily with breakfast.   Yes [provider]  Multiple Vitamins-Minerals (ICAPS) CAPS Take by mouth.   Yes [provider]  Multiple Vitamins-Minerals (PRESERVISION/LUTEIN PO) Take by mouth.   Yes [provider]  omeprazole (PRILOSEC) 20 MG capsule Take 20 mg by mouth daily.   Yes [provider]  omeprazole (PRILOSEC) 20 MG capsule omeprazole 20 mg tablet,delayed release   1 tablet every day by oral route.   Yes [provider]  sertraline (ZOLOFT) 50 MG tablet Take 50 mg by mouth daily.   Yes [provider]  traMADol (ULTRAM) 50 MG tablet 1-2 tabs po qd prn 09/20/16  Yes Conty, [place], MD  mupirocin ointment (BACTROBAN) 2 % Place 1 application into the nose 2 (two) times daily. 11/05/16   Allegra GranaArnett, Constance Hackenberg G, FNP   Meds Ordered and Administered this Visit  Medications - No data to display  BP 123/67 (BP Location: Left Arm)   Temp 98.6 F (37 C) (Oral)   Resp 16   Ht 5\' 5"  (1.651 m)   Wt 234 lb (106.1 kg)   SpO2 97%   BMI 38.94 kg/m  No data found.   Physical Exam  Constitutional: She appears well-developed and well-nourished.  Eyes: Conjunctivae are normal.  Cardiovascular: Normal rate, regular rhythm, normal heart sounds and normal pulses.   Pulmonary/Chest: Effort normal and breath sounds normal. She has no wheezes. She has no rhonchi. She has no rales.  Musculoskeletal:  Right great toe paronychia noted medial side. Consent provided for incision and drainage. Declines any pain medication for procedure. Area prepped with Betadine. Small pinpoint hole made with #[id number]. Slight pressure with gauze applied and serous discharge expressed. Wound culture obtained. Patient tolerated procedure well. Bleeding controlled with pressure. Nurse covered in bandage.   Neurological: She is alert.  Skin: Skin is warm and dry.  Psychiatric: She has a normal mood and affect. Her speech is normal and behavior is normal. Thought content normal.  Vitals reviewed.   Urgent Care Course     Procedures (including critical care time)  Labs Review Labs Reviewed  AEROBIC CULTURE (SUPERFICIAL SPECIMEN)    Imaging Review No results found.       MDM   1. Paronychia of great toe of right foot    Afebrile. No systemic features. Pending wound culture. Patient tolerated incision drainage quite well. She preferred to start a topical antibiotic due to blue dye allergy ( anaphylaxis)  versus oral antibiotic which I had offered for empiric coverage. She states she's not had allergies to any ointments.  Encourage a follow-up  podiatrist as toe nail may come off. Epsom soaks.  Return precautions given.     Allegra Grana, FNP 11/05/16 1054

## 2016-11-05 NOTE — ED Triage Notes (Signed)
Patient c/o drop a soda can on right big toe. Patient with right big toe pain and redness.

## 2016-11-08 LAB — AEROBIC CULTURE W GRAM STAIN (SUPERFICIAL SPECIMEN)
Culture: NORMAL
Special Requests: NORMAL

## 2016-11-08 LAB — AEROBIC CULTURE  (SUPERFICIAL SPECIMEN)

## 2016-11-25 ENCOUNTER — Telehealth: Payer: Self-pay | Admitting: Emergency Medicine

## 2016-11-25 NOTE — Telephone Encounter (Signed)
Patient notified of her wound culture result.  Patient states that she still has some drainage from around her toe nail.  Patient to continue with the ointment. Patient was advised to follow-up here or with PCP if her symptoms do not improve or worsen.  Patient verbalized understanding.

## 2017-05-05 ENCOUNTER — Encounter: Payer: Self-pay | Admitting: *Deleted

## 2017-05-05 ENCOUNTER — Ambulatory Visit
Admission: EM | Admit: 2017-05-05 | Discharge: 2017-05-05 | Disposition: A | Discharge status: Home / Self Care | Payer: BLUE CROSS/BLUE SHIELD | Attending: Emergency Medicine | Admitting: Emergency Medicine

## 2017-05-05 ENCOUNTER — Ambulatory Visit (INDEPENDENT_AMBULATORY_CARE_PROVIDER_SITE_OTHER): Payer: BLUE CROSS/BLUE SHIELD

## 2017-05-05 DIAGNOSIS — R05 Cough: Secondary | ICD-10-CM | POA: Diagnosis not present

## 2017-05-05 DIAGNOSIS — R0981 Nasal congestion: Secondary | ICD-10-CM | POA: Diagnosis not present

## 2017-05-05 DIAGNOSIS — J09X2 Influenza due to identified novel influenza A virus with other respiratory manifestations: Secondary | ICD-10-CM

## 2017-05-05 DIAGNOSIS — R509 Fever, unspecified: Secondary | ICD-10-CM

## 2017-05-05 DIAGNOSIS — J101 Influenza due to other identified influenza virus with other respiratory manifestations: Secondary | ICD-10-CM

## 2017-05-05 LAB — RAPID INFLUENZA A&B ANTIGENS (ARMC ONLY): INFLUENZA A (ARMC): POSITIVE — AB

## 2017-05-05 LAB — RAPID INFLUENZA A&B ANTIGENS: Influenza B (ARMC): NEGATIVE

## 2017-05-05 MED ORDER — ALBUTEROL SULFATE HFA 108 (90 BASE) MCG/ACT IN AERS: 2.0000 | INHALATION_SPRAY | RESPIRATORY_TRACT | 0 refills | Status: DC | PRN

## 2017-05-05 MED ORDER — OSELTAMIVIR PHOSPHATE 75 MG PO CAPS: 75.0000 mg | ORAL_CAPSULE | Freq: Two times a day (BID) | ORAL | 0 refills | Status: DC

## 2017-05-05 MED ORDER — AEROCHAMBER PLUS MISC: 2 refills | Status: DC

## 2017-05-05 MED ORDER — BENZONATATE 200 MG PO CAPS: 200.0000 mg | ORAL_CAPSULE | Freq: Three times a day (TID) | ORAL | 0 refills | Status: DC | PRN

## 2017-05-05 MED ORDER — FLUTICASONE PROPIONATE 50 MCG/ACT NA SUSP: 2.0000 | Freq: Every day | NASAL | 0 refills | Status: AC

## 2017-05-05 NOTE — Discharge Instructions (Signed)
2 puffs from your albuterol inhaler every 4-6 hours as needed for coughing, wheezing, shortness of breath, chest pain.  Finish the Tamiflu.  Tessalon will help with the cough.  Start some saline nasal irrigation with a Lloyd HugerNeil med sinus rinse.  Flonase will help with the nasal congestion and the cough.

## 2017-05-05 NOTE — ED Provider Notes (Signed)
HPI  SUBJECTIVE:  Miranda Pollard is a 67 y.o. female who presents with 2 days of nasal congestion, rhinorrhea, postnasal drip, feeling feverish, but has not taken her temperature at home.  She reports a cough productive of clear mucus.  States that she is unable to sleep at night secondary to the cough.  She states that her ribs are sore on the right and that she has diffuse muscular soreness along her abdomen from coughing.  She denies any other abdominal pain.  She denies sinus pain or pressure, upper dental pain.  No body aches, headaches, sore throat, wheezing, chest pain.  No shortness of breath, dyspnea on exertion, sore throat.  She got a flu shot this year.  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  No antibiotics in the past month.  No antipyretic in the past 6-8 hours.  She tried lying on her right side, a home remedy involving rye whiskey and elevating the head of her bed.  She states that the whiskey slows her cough down.  No aggravating factors.  She has a past medical history of asthma, diabetes, hypertension, obstructive sleep apnea.  States her glucose has been running within normal limits for her.  PMD: Dr. Maryjane Hurter.   Past Medical History:  Diagnosis Date  . Apnea   . Asthma   . Calculus of kidney   . Diabetes mellitus without complication (HCC)   . Family history of breast cancer 03/04/2015   MyRisk NEG  . Genital herpes   . History of Papanicolaou smear of cervix 05/02/2016   NIL/NEG  . Hypertension   . Hypertension   . Macular degeneration (senile) of retina     Past Surgical History:  Procedure Laterality Date  . CARPAL TUNNEL RELEASE  1980's  . CHOLECYSTECTOMY, LAPAROSCOPIC  2002  . COLONOSCOPY  07/2014  . ESOPHAGOGASTRODUODENOSCOPY ENDOSCOPY  07/2014  . HYSTEROSCOPY  2004   cystic hyperplasia  . HYSTEROSCOPY  2008   postemopausa bleeding weakly proliferative endometrium on pathology  . LAPAROSCOPY  1993   Laparoscopy and laparotomy for "ovarian cysts and fibroid"    Family History  Problem Relation Age of Onset  . Breast cancer Sister 62  . Breast cancer Cousin 60       2 pat cousins  . Hypertension Mother   . Melanoma Mother 28  . Asthma Father   . Congestive Heart Failure Father   . COPD Father   . Diabetes Father   . Emphysema Father   . Hypertension Brother   . Sleep apnea Brother   . Colon cancer Maternal Grandmother     Social History   Tobacco Use  . Smoking status: Never Smoker  . Smokeless tobacco: Never Used  Substance Use Topics  . Alcohol use: No  . Drug use: No    No current facility-administered medications for this encounter.   Current Outpatient Medications:  .  acetaminophen (TYLENOL) 650 MG CR tablet, Take by mouth., Disp: , Rfl:  .  aspirin 81 MG chewable tablet, Chew by mouth daily., Disp: , Rfl:  .  aspirin EC 81 MG tablet, Take 81 mg by mouth daily., Disp: , Rfl:  .  bevacizumab (AVASTIN) 1.25 mg/0.1 mL SOLN, Inject into the vein., Disp: , Rfl:  .  cetirizine (ZYRTEC) 10 MG tablet, Take 10 mg by mouth daily., Disp: , Rfl:  .  Cyanocobalamin (VITAMIN B 12 PO), Take by mouth., Disp: , Rfl:  .  liraglutide (VICTOZA) 18 MG/3ML SOPN, Victoza 0.6 mg/0.1 mL (18 mg/3 mL) subcutaneous pen injector   1.8 mL every day by sub-q route., Disp: , Rfl:  .  lisinopril (PRINIVIL,ZESTRIL) 20 MG tablet, Take 20 mg by mouth daily., Disp: , Rfl:  .  metFORMIN (GLUMETZA) 1000 MG (MOD) 24 hr tablet, Take 1,000 mg by mouth daily with breakfast., Disp: , Rfl:  .  Multiple Vitamins-Minerals (ICAPS) CAPS, Take by mouth., Disp: , Rfl:  .  Multiple Vitamins-Minerals (PRESERVISION/LUTEIN PO), Take by mouth., Disp: , Rfl:  .  omeprazole (PRILOSEC) 20 MG capsule, Take 20 mg by mouth daily., Disp: , Rfl:  .  omeprazole (PRILOSEC) 20 MG capsule, omeprazole 20 mg tablet,delayed release   1 tablet every day by oral route., Disp: , Rfl:  .  sertraline (ZOLOFT) 50 MG tablet, Take 50 mg by  mouth daily., Disp: , Rfl:  .  albuterol (PROVENTIL HFA;VENTOLIN HFA) 108 (90 Base) MCG/ACT inhaler, Inhale 2 puffs into the lungs every 4 (four) hours as needed for wheezing or shortness of breath., Disp: 1 Inhaler, Rfl: 0 .  benzonatate (TESSALON) 200 MG capsule, Take 1 capsule (200 mg total) by mouth 3 (three) times daily as needed for cough., Disp: 30 capsule, Rfl: 0 .  fluticasone (FLONASE) 50 MCG/ACT nasal spray, Place 2 sprays into both nostrils daily., Disp: 16 g, Rfl: 0 .  oseltamivir (TAMIFLU) 75 MG capsule, Take 1 capsule (75 mg total) by mouth 2 (two) times daily. X 5 days, Disp: 10 capsule, Rfl: 0 .  Spacer/Aero-Holding Chambers (AEROCHAMBER PLUS) inhaler, Use as instructed, Disp: 1 each, Rfl: 2  Allergies  Allergen Reactions  . Blue Dyes (Parenteral) Anaphylaxis  . Ciprofloxacin Nausea And Vomiting  . Hydrocodone Palpitations     ROS  As noted in HPI.   Physical Exam  BP 114/64 (BP Location: Left Arm)   Pulse 80   Temp 99.5 F (37.5 C) (Oral) Comment: after Tylenol (patient took her own medication)  Resp 16   Ht 5\' 5"  (1.651 m)   Wt 220 lb (99.8 kg)   SpO2 98%   BMI 36.61 kg/m   Constitutional: Well developed, well nourished, no acute distress Eyes:  EOMI, conjunctiva normal bilaterally HENT: Normocephalic, atraumatic,mucus membranes moist.  Positive clear rhinorrhea.  Erythematous, but not swollen turbinates.  No sinus tenderness.  Normal oropharynx.  No postnasal drip, cobblestoning. Respiratory: Normal inspiratory effort, lungs clear bilaterally.  Positive tenderness right lower rib area.   Cardiovascular: Normal rate regular rhythm, no murmurs, rubs, gallops GI: Obese, soft, nontender, nondistended.  Negative Murphy, negative McBurney. skin: No rash, skin intact Musculoskeletal: no deformities Neurologic: Alert & oriented x 3, no focal neuro deficits Psychiatric: Speech and behavior appropriate   ED Course   Medications - No data to display  Orders  Placed This Encounter  Procedures  . Rapid Influenza A&B Antigens ([facility] only)    Standing Status:   Standing    Number of Occurrences:   1  . DG Chest 2 View    Standing Status:   Standing    Number of Occurrences:   1    Order Specific Question:   Reason for Exam (SYMPTOM  OR DIAGNOSIS REQUIRED)    Answer:   r/o PNA esp RLL  . Droplet precaution    Standing Status:   Standing    Number of Occurrences:   1    Results for orders placed or performed during the hospital encounter of 05/05/17 (from the past 24  hour(s))  Rapid Influenza A&B Antigens ([facility] only)     Status: Abnormal   Collection Time: 05/05/17 10:19 AM  Result Value Ref Range   Influenza A ([facility]) POSITIVE (A) NEGATIVE   Influenza B ([facility]) NEGATIVE NEGATIVE   Dg Chest 2 View  Result Date: 05/05/2017 CLINICAL DATA:  Cough, fever, congestion EXAM: CHEST  2 VIEW COMPARISON:  01/23/2016 FINDINGS: Heart and mediastinal contours are within normal limits. No focal opacities or effusions. No acute bony abnormality. IMPRESSION: No active cardiopulmonary disease. Electronically Signed   By: Charlett NoseKevin  Dover M.D.   On: 05/05/2017 10:38    ED Clinical Impression  Influenza A   ED Assessment/Plan  Patient took 975 mg of Tylenol that she had in her purse for the fever.  Will recheck temp.  Repeat temp 99.5.  Checking flu because this would change management and a chest x-ray due to the focal tenderness in the right lower rib area.  Doubt intra-abdominal process.  Flu a positive. Reviewed imaging independently.  No pneumonia.  See radiology report for details.  Home with Tamiflu, Tessalon, Flonase, saline nasal irrigation.  We will refill albuterol inhaler pacer.  2 puffs every 4-6 hours as needed for coughing, wheezing, shortness of breath.  Follow-up with primary care physician in 5 days if not better, to the ER if she gets worse.  Discussed labs, imaging, MDM, plan and followup with patient. Discussed sn/sx that should prompt  return to the ED. patient agrees with plan.   Meds ordered this encounter  Medications  . albuterol (PROVENTIL HFA;VENTOLIN HFA) 108 (90 Base) MCG/ACT inhaler    Sig: Inhale 2 puffs into the lungs every 4 (four) hours as needed for wheezing or shortness of breath.    Dispense:  1 Inhaler    Refill:  0  . Spacer/Aero-Holding Chambers (AEROCHAMBER PLUS) inhaler    Sig: Use as instructed    Dispense:  1 each    Refill:  2  . benzonatate (TESSALON) 200 MG capsule    Sig: Take 1 capsule (200 mg total) by mouth 3 (three) times daily as needed for cough.    Dispense:  30 capsule    Refill:  0  . oseltamivir (TAMIFLU) 75 MG capsule    Sig: Take 1 capsule (75 mg total) by mouth 2 (two) times daily. X 5 days    Dispense:  10 capsule    Refill:  0  . fluticasone (FLONASE) 50 MCG/ACT nasal spray    Sig: Place 2 sprays into both nostrils daily.    Dispense:  16 g    Refill:  0    *This clinic note was created using Scientist, clinical (histocompatibility and immunogenetics)Dragon dictation software. Therefore, there may be occasional mistakes despite careful proofreading.   ?   Domenick GongMortenson, Ofilia Rayon, MD 05/05/17 [phone]

## 2017-05-05 NOTE — ED Triage Notes (Signed)
Runny nose, productive cough- clear, head and chest congestion, chest and abdominal soreness from coughing, x3 days.

## 2017-06-22 ENCOUNTER — Emergency Department: Payer: Worker's Compensation

## 2017-06-22 ENCOUNTER — Emergency Department
Admission: EM | Admit: 2017-06-22 | Discharge: 2017-06-22 | Disposition: A | Discharge status: Home / Self Care | Payer: Worker's Compensation | Attending: Emergency Medicine | Admitting: Emergency Medicine

## 2017-06-22 ENCOUNTER — Encounter: Payer: Self-pay | Admitting: Emergency Medicine

## 2017-06-22 ENCOUNTER — Other Ambulatory Visit: Payer: Self-pay

## 2017-06-22 DIAGNOSIS — Y92481 Parking lot as the place of occurrence of the external cause: Secondary | ICD-10-CM | POA: Diagnosis not present

## 2017-06-22 DIAGNOSIS — S50311A Abrasion of right elbow, initial encounter: Secondary | ICD-10-CM | POA: Diagnosis not present

## 2017-06-22 DIAGNOSIS — Y9301 Activity, walking, marching and hiking: Secondary | ICD-10-CM | POA: Insufficient documentation

## 2017-06-22 DIAGNOSIS — I10 Essential (primary) hypertension: Secondary | ICD-10-CM | POA: Insufficient documentation

## 2017-06-22 DIAGNOSIS — M25511 Pain in right shoulder: Secondary | ICD-10-CM | POA: Diagnosis not present

## 2017-06-22 DIAGNOSIS — S80211A Abrasion, right knee, initial encounter: Secondary | ICD-10-CM | POA: Diagnosis not present

## 2017-06-22 DIAGNOSIS — Y990 Civilian activity done for income or pay: Secondary | ICD-10-CM | POA: Insufficient documentation

## 2017-06-22 DIAGNOSIS — Z79899 Other long term (current) drug therapy: Secondary | ICD-10-CM | POA: Insufficient documentation

## 2017-06-22 DIAGNOSIS — E119 Type 2 diabetes mellitus without complications: Secondary | ICD-10-CM | POA: Diagnosis not present

## 2017-06-22 DIAGNOSIS — Z7982 Long term (current) use of aspirin: Secondary | ICD-10-CM | POA: Diagnosis not present

## 2017-06-22 DIAGNOSIS — W101XXA Fall (on)(from) sidewalk curb, initial encounter: Secondary | ICD-10-CM | POA: Diagnosis not present

## 2017-06-22 DIAGNOSIS — T07XXXA Unspecified multiple injuries, initial encounter: Secondary | ICD-10-CM

## 2017-06-22 DIAGNOSIS — J45909 Unspecified asthma, uncomplicated: Secondary | ICD-10-CM | POA: Insufficient documentation

## 2017-06-22 DIAGNOSIS — W19XXXA Unspecified fall, initial encounter: Secondary | ICD-10-CM

## 2017-06-22 DIAGNOSIS — S59801A Other specified injuries of right elbow, initial encounter: Secondary | ICD-10-CM | POA: Diagnosis present

## 2017-06-22 MED ORDER — BACITRACIN ZINC 500 UNIT/GM EX OINT
TOPICAL_OINTMENT | Freq: Once | CUTANEOUS | Status: AC
  Administered 2017-06-22: 06:00:00 via TOPICAL

## 2017-06-22 MED ORDER — BACITRACIN ZINC 500 UNIT/GM EX OINT
TOPICAL_OINTMENT | CUTANEOUS | Status: AC
  Filled 2017-06-22: qty 0.9

## 2017-06-22 MED ORDER — ACETAMINOPHEN 500 MG PO TABS
1000.0000 mg | ORAL_TABLET | Freq: Once | ORAL | Status: AC
  Administered 2017-06-22: 1000 mg via ORAL

## 2017-06-22 MED ORDER — ACETAMINOPHEN 500 MG PO TABS
ORAL_TABLET | ORAL | Status: AC
  Filled 2017-06-22: qty 2

## 2017-06-22 NOTE — ED Notes (Addendum)
Patient plans to go back to work after discharge, declined Percocet for pain at this time. Patient requested Tylenol. Tylenol given per acute pain protocol and patient's preference.

## 2017-06-22 NOTE — ED Triage Notes (Addendum)
Patient to ER for c/o fall while coming in to work tonight. Patient states she was looking down at badge, turned ankle on curb. Patient fell, resulting in hematoma and abrasion to right knee, as well as abrasion to right elbow. Denies any LOC.  At the end of triage, patient also reports right shoulder starting to hurt.

## 2017-06-22 NOTE — ED Provider Notes (Signed)
[facility] Emergency Department Provider Note   ____________________________________________   First MD Initiated Contact with Patient 06/22/17 [phone]     (approximate)  I have reviewed the triage vital signs and the nursing notes.   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  She was walking into work, looking down on her badge and rolled her ankle on the curb.  She fell onto her right side, scraping her right elbow and knee.  Complains of right shoulder, elbow and knee pain.  Denies striking head or LOC.  Denies headache, vision changes, neck pain, chest pain, shortness of breath, abdominal pain, nausea or vomiting.  Tetanus is up-to-date.   Past Medical History:  Diagnosis Date  . Apnea   . Asthma   . Calculus of kidney   . Diabetes mellitus without complication (HCC)   . Family history of breast cancer 03/04/2015   MyRisk NEG  . Genital herpes   . History of Papanicolaou smear of cervix 05/02/2016   NIL/NEG  . Hypertension   . Hypertension   . Macular degeneration (senile) of retina     Patient Active Problem List   Diagnosis Date Noted  . Family history of breast cancer 09/07/2016    Past Surgical History:  Procedure Laterality Date  . CARPAL TUNNEL RELEASE  1980's  . CHOLECYSTECTOMY, LAPAROSCOPIC  2002  . COLONOSCOPY  07/2014  . ESOPHAGOGASTRODUODENOSCOPY ENDOSCOPY  07/2014  . HYSTEROSCOPY  2004   cystic hyperplasia  . HYSTEROSCOPY  2008   postemopausa bleeding weakly proliferative endometrium on pathology  . LAPAROSCOPY  1993   Laparoscopy and laparotomy for "ovarian cysts and fibroid"    Prior to Admission medications   Medication Sig Start Date End Date Taking? Authorizing Provider  acetaminophen (TYLENOL) 650 MG CR tablet Take by mouth.    [provider]  albuterol (PROVENTIL HFA;VENTOLIN  HFA) 108 (90 Base) MCG/ACT inhaler Inhale 2 puffs into the lungs every 4 (four) hours as needed for wheezing or shortness of breath. 05/05/17   Domenick Gong, MD  aspirin 81 MG chewable tablet Chew by mouth daily.    [provider]  aspirin EC 81 MG tablet Take 81 mg by mouth daily.    [provider]  benzonatate (TESSALON) 200 MG capsule Take 1 capsule (200 mg total) by mouth 3 (three) times daily as needed for cough. 05/05/17   Domenick Gong, MD  bevacizumab (AVASTIN) 1.25 mg/0.1 mL SOLN Inject into the vein.    [provider]  cetirizine (ZYRTEC) 10 MG tablet Take 10 mg by mouth daily.    [provider]  Cyanocobalamin (VITAMIN B 12 PO) Take by mouth.    [provider]  fluticasone (FLONASE) 50 MCG/ACT nasal spray Place 2 sprays into both nostrils daily. 05/05/17   Domenick Gong, MD  liraglutide (VICTOZA) 18 MG/3ML SOPN Victoza 0.6 mg/0.1 mL (18 mg/3 mL) subcutaneous pen injector   1.8 mL every day by sub-q route.    [provider]  lisinopril (PRINIVIL,ZESTRIL) 20 MG tablet Take 20 mg by mouth daily.    [provider]  metFORMIN (GLUMETZA) 1000 MG (MOD) 24 hr tablet Take 1,000 mg by mouth daily with breakfast.    [provider]  Multiple Vitamins-Minerals (ICAPS) CAPS Take by mouth.    [provider]  Multiple Vitamins-Minerals (PRESERVISION/LUTEIN PO)  Take by mouth.    [provider]  omeprazole (PRILOSEC) 20 MG capsule Take 20 mg by mouth daily.    [provider]  omeprazole (PRILOSEC) 20 MG capsule omeprazole 20 mg tablet,delayed release   1 tablet every day by oral route.    [provider]  oseltamivir (TAMIFLU) 75 MG capsule Take 1 capsule (75 mg total) by mouth 2 (two) times daily. X 5 days 05/05/17   Domenick GongMortenson, Ashley, MD  sertraline (ZOLOFT) 50 MG tablet Take 50 mg by mouth daily.    [provider]  Spacer/Aero-Holding Chambers (AEROCHAMBER PLUS)  inhaler Use as instructed 05/05/17   Domenick GongMortenson, Ashley, MD    Allergies Blue dyes (parenteral); Ciprofloxacin; and Hydrocodone  Family History  Problem Relation Age of Onset  . Breast cancer Sister 3639  . Breast cancer Cousin 60       2 pat cousins  . Hypertension Mother   . Melanoma Mother [id number]  . Asthma Father   . Congestive Heart Failure Father   . COPD Father   . Diabetes Father   . Emphysema Father   . Hypertension Brother   . Sleep apnea Brother   . Colon cancer Maternal Grandmother     Social History Social History   Tobacco Use  . Smoking status: Never Smoker  . Smokeless tobacco: Never Used  Substance Use Topics  . Alcohol use: No  . Drug use: No    Review of Systems  Constitutional: No fever/chills. Eyes: No visual changes. ENT: No sore throat. Cardiovascular: Denies chest pain. Respiratory: Denies shortness of breath. Gastrointestinal: No abdominal pain.  No nausea, no vomiting.  No diarrhea.  No constipation. Genitourinary: Negative for dysuria. Musculoskeletal: Positive for right shoulder, elbow and knee pain.  Negative for back pain. Skin: Negative for rash. Neurological: Negative for headaches, focal weakness or numbness.   ____________________________________________   PHYSICAL EXAM:  VITAL SIGNS: ED Triage Vitals  Enc Vitals Group     BP 06/22/17 0022 (!) 155/83     Pulse Rate 06/22/17 0022 81     Resp 06/22/17 0022 18     Temp 06/22/17 0022 98.1 F (36.7 C)     Temp Source 06/22/17 0022 Oral     SpO2 06/22/17 0022 96 %     Weight 06/22/17 0023 222 lb (100.7 kg)     Height 06/22/17 0023 5\' 5"  (1.651 m)     Head Circumference --      Peak Flow --      Pain Score 06/22/17 0023 7     Pain Loc --      Pain Edu? --      Excl. in GC? --     Constitutional: Alert and oriented. Well appearing and in no acute distress. Eyes: Conjunctivae are normal. PERRL. EOMI. Head: Atraumatic. Nose: No congestion/rhinnorhea. Mouth/Throat: Mucous  membranes are moist.  Oropharynx non-erythematous. Neck: No stridor.  No cervical spine tenderness to palpation. Cardiovascular: Normal rate, regular rhythm. Grossly normal heart sounds.  Good peripheral circulation. Respiratory: Normal respiratory effort.  No retractions. Lungs CTAB. Gastrointestinal: Soft and nontender. No distention. No abdominal bruits. No CVA tenderness. Musculoskeletal:  RUE: Right shoulder tender to palpation.  Limited range of motion secondary to pain. Right elbow abrasion.  Elbow with full range of motion without pain.  2+ radial pulse.  Brisk, less than 5-second capillary refill. RLE: Abrasion to anterior right knee.  Full range of motion.  2+ distal pulses.  Brisk, less than 5-second capillary  refill. Neurologic:  Normal speech and language. No gross focal neurologic deficits are appreciated.  Skin:  Skin is warm, dry and intact. No rash noted. Psychiatric: Mood and affect are normal. Speech and behavior are normal.  ____________________________________________   LABS (all labs ordered are listed, but only abnormal results are displayed)  Labs Reviewed - No data to display ____________________________________________  EKG  None ____________________________________________  RADIOLOGY  ED MD interpretation: No acute traumatic injuries  Official radiology report(s): Dg Shoulder Right  Result Date: 06/22/2017 CLINICAL DATA:  Status post fall, with right shoulder pain. Initial encounter. EXAM: RIGHT SHOULDER - 2+ VIEW COMPARISON:  None. FINDINGS: There is no evidence of fracture or dislocation. The right humeral head is seated within the glenoid fossa. Degenerative change is noted at the right acromioclavicular joint. No significant soft tissue abnormalities are seen. The visualized portions of the right lung are clear. IMPRESSION: No evidence of fracture or dislocation. Electronically Signed   By: Roanna Raider M.D.   On: 06/22/2017 01:19   Dg Elbow Complete  Right  Result Date: 06/22/2017 CLINICAL DATA:  Status post fall, with right elbow pain and abrasion. Initial encounter. EXAM: RIGHT ELBOW - COMPLETE 3+ VIEW COMPARISON:  None. FINDINGS: There is no evidence of fracture or dislocation. The visualized joint spaces are preserved. No significant joint effusion is identified. The soft tissues are unremarkable in appearance. IMPRESSION: No evidence of fracture or dislocation. Electronically Signed   By: Roanna Raider M.D.   On: 06/22/2017 01:18   Dg Knee Complete 4 Views Right  Result Date: 06/22/2017 CLINICAL DATA:  Status post fall, with right knee abrasion and hematoma. Initial encounter. EXAM: RIGHT KNEE - COMPLETE 4+ VIEW COMPARISON:  None. FINDINGS: There is no evidence of fracture or dislocation. The joint spaces are preserved. No significant degenerative change is seen; the patellofemoral joint is grossly unremarkable in appearance. No significant joint effusion is seen. The visualized soft tissues are normal in appearance. IMPRESSION: No evidence of fracture or dislocation. Electronically Signed   By: Roanna Raider M.D.   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  Has abrasions to her right elbow and knee.  These will be cleansed, antibiotic ointment applied and dressed.  Strict return precautions given.  Patient verbalizes understanding and agrees with plan of care.      ____________________________________________   FINAL CLINICAL  IMPRESSION(S) / ED DIAGNOSES  Final diagnoses:  Fall, initial encounter  Abrasions of multiple sites     ED Discharge Orders    None       Note:  This document was prepared using Dragon voice recognition software and may include unintentional dictation errors.    Irean Hong, MD 06/22/17 [phone]

## 2017-06-22 NOTE — Discharge Instructions (Signed)
Keep wounds clean and dry.  Return to the ER for worsening symptoms, increased redness/swelling, purulent discharge or other concerns. °

## 2017-06-22 NOTE — ED Notes (Signed)
Pt fell in prarking lot over curb. Abrasions to R elbow and R knee

## 2017-06-22 NOTE — ED Notes (Signed)
Worker's comp profile reviewed. No testing needed per profile unless upon request. No request at this time.

## 2017-06-22 NOTE — ED Notes (Signed)
Patient transported to X-ray 

## 2017-10-31 ENCOUNTER — Encounter: Payer: Self-pay | Admitting: Emergency Medicine

## 2017-10-31 ENCOUNTER — Other Ambulatory Visit: Payer: Self-pay

## 2017-10-31 ENCOUNTER — Ambulatory Visit (INDEPENDENT_AMBULATORY_CARE_PROVIDER_SITE_OTHER)
Admission: EM | Admit: 2017-10-31 | Discharge: 2017-10-31 | Disposition: A | Discharge status: Home / Self Care | Payer: BLUE CROSS/BLUE SHIELD | Source: Home / Self Care | Attending: Family Medicine | Admitting: Family Medicine

## 2017-10-31 ENCOUNTER — Emergency Department: Payer: BLUE CROSS/BLUE SHIELD

## 2017-10-31 ENCOUNTER — Emergency Department
Admission: EM | Admit: 2017-10-31 | Discharge: 2017-10-31 | Disposition: A | Discharge status: Home / Self Care | Payer: BLUE CROSS/BLUE SHIELD | Attending: Student in an Organized Health Care Education/Training Program | Admitting: Student in an Organized Health Care Education/Training Program

## 2017-10-31 DIAGNOSIS — Z79899 Other long term (current) drug therapy: Secondary | ICD-10-CM | POA: Insufficient documentation

## 2017-10-31 DIAGNOSIS — R079 Chest pain, unspecified: Secondary | ICD-10-CM | POA: Diagnosis present

## 2017-10-31 DIAGNOSIS — E119 Type 2 diabetes mellitus without complications: Secondary | ICD-10-CM | POA: Diagnosis not present

## 2017-10-31 DIAGNOSIS — R0602 Shortness of breath: Secondary | ICD-10-CM | POA: Insufficient documentation

## 2017-10-31 DIAGNOSIS — I10 Essential (primary) hypertension: Secondary | ICD-10-CM | POA: Diagnosis not present

## 2017-10-31 DIAGNOSIS — Z7982 Long term (current) use of aspirin: Secondary | ICD-10-CM | POA: Insufficient documentation

## 2017-10-31 DIAGNOSIS — J45909 Unspecified asthma, uncomplicated: Secondary | ICD-10-CM | POA: Insufficient documentation

## 2017-10-31 DIAGNOSIS — Z7984 Long term (current) use of oral hypoglycemic drugs: Secondary | ICD-10-CM | POA: Insufficient documentation

## 2017-10-31 LAB — CBC
HCT: 44.4 % (ref 35.0–47.0)
Hemoglobin: 14.6 g/dL (ref 12.0–16.0)
MCH: 27 pg (ref 26.0–34.0)
MCHC: 32.8 g/dL (ref 32.0–36.0)
MCV: 82.4 fL (ref 80.0–100.0)
Platelets: 322 10*3/uL (ref 150–440)
RBC: 5.38 MIL/uL — ABNORMAL HIGH (ref 3.80–5.20)
RDW: 14.9 % — ABNORMAL HIGH (ref 11.5–14.5)
WBC: 11.5 10*3/uL — ABNORMAL HIGH (ref 3.6–11.0)

## 2017-10-31 LAB — BASIC METABOLIC PANEL
Anion gap: 11 (ref 5–15)
BUN: 13 mg/dL (ref 8–23)
CO2: 27 mmol/L (ref 22–32)
Calcium: 9.6 mg/dL (ref 8.9–10.3)
Chloride: 101 mmol/L (ref 98–111)
Creatinine, Ser: 0.54 mg/dL (ref 0.44–1.00)
GFR calc Af Amer: >60 mL/min (ref >60)
GFR calc non Af Amer: >60 mL/min (ref >60)
Glucose, Bld: 224 mg/dL — ABNORMAL HIGH (ref 70–99)
Potassium: 3.3 mmol/L — ABNORMAL LOW (ref 3.5–5.1)
Sodium: 139 mmol/L (ref 135–145)

## 2017-10-31 LAB — TSH: TSH: 0.516 u[IU]/mL (ref 0.350–4.500)

## 2017-10-31 LAB — TROPONIN I
Troponin I: <0.03 ng/mL (ref <0.03)
Troponin I: <0.03 ng/mL (ref <0.03)

## 2017-10-31 LAB — FIBRIN DERIVATIVES D-DIMER (ARMC ONLY): FIBRIN DERIVATIVES D-DIMER (ARMC): 473.4 ng{FEU}/mL (ref 0.00–499.00)

## 2017-10-31 MED ORDER — LORAZEPAM 0.5 MG PO TABS
0.5000 mg | ORAL_TABLET | Freq: Once | ORAL | Status: AC
  Administered 2017-10-31: 0.5 mg via ORAL
  Filled 2017-10-31: qty 1

## 2017-10-31 NOTE — ED Triage Notes (Signed)
Patient reports having panic attacks in the past and states this feels the same. Patient also reports she has been under a a lot of stress.

## 2017-10-31 NOTE — Discharge Instructions (Signed)
Go the ER for serial troponins.  Take care  Dr. Adriana Simasook

## 2017-10-31 NOTE — ED Provider Notes (Signed)
[facility]    CSN: [id number] Arrival date & time: 10/31/17  1437  History   Chief Complaint Chief Complaint  Patient presents with  . Shortness of Breath  . Chest Pain   HPI  67 year old female presents with chest pain and shortness of breath.  Patient reports that she had an episode of chest pain shortness of breath on Thursday.  This subsequently resolved.  This morning she was putting up a canopy.  She states that she developed sudden onset shortness of breath and chest pain.  She describes the chest pain as tightness.  She reports that she is having left shoulder pain as well.  She states that it is currently improved.  Currently 2-3/10 in severity.  No reports of diaphoresis.  Patient states that she is under a great deal of stress.  She states that she is recently had a loss of her cousin as well as some friends/relatives.  She thinks that this is anxiety driven but is unsure.  She would like to be examined thoroughly today.  She states that she was not really exerting herself so the chest pain or shortness of breath or not really driven by exertion.  No other associated symptoms.  No other complaints.  Past Medical History:  Diagnosis Date  . Apnea   . Asthma   . Calculus of kidney   . Diabetes mellitus without complication (HCC)   . Family history of breast cancer 03/04/2015   MyRisk NEG  . Genital herpes   . History of Papanicolaou smear of cervix 05/02/2016   NIL/NEG  . Hypertension   . Hypertension   . Macular degeneration (senile) of retina     Patient Active Problem List   Diagnosis Date Noted  . Family history of breast cancer 09/07/2016    Past Surgical History:  Procedure Laterality Date  . CARPAL TUNNEL RELEASE  1980's  . CHOLECYSTECTOMY, LAPAROSCOPIC  2002  . COLONOSCOPY  07/2014  . ESOPHAGOGASTRODUODENOSCOPY ENDOSCOPY  07/2014  . HYSTEROSCOPY  2004   cystic hyperplasia  . HYSTEROSCOPY  2008   postemopausa bleeding weakly proliferative  endometrium on pathology  . LAPAROSCOPY  1993   Laparoscopy and laparotomy for "ovarian cysts and fibroid"    OB History    Gravida  1   Para      Term      Preterm      AB  1   Living        SAB  1   TAB      Ectopic      Multiple      Live Births  0            Home Medications    Prior to Admission medications   Medication Sig Start Date End Date Taking? Authorizing Provider  acetaminophen (TYLENOL) 650 MG CR tablet Take by mouth.   Yes [provider]  albuterol (PROVENTIL HFA;VENTOLIN HFA) 108 (90 Base) MCG/ACT inhaler Inhale 2 puffs into the lungs every 4 (four) hours as needed for wheezing or shortness of breath. 05/05/17  Yes Domenick Gong, MD  aspirin 81 MG chewable tablet Chew by mouth daily.   Yes [provider]  aspirin EC 81 MG tablet Take 81 mg by mouth daily.   Yes [provider]  benzonatate (TESSALON) 200 MG capsule Take 1 capsule (200 mg total) by mouth 3 (three) times daily as needed for cough. 05/05/17  Yes Domenick Gong, MD  bevacizumab (AVASTIN) 1.25 mg/0.1  mL SOLN Inject into the vein.   Yes [provider]  cetirizine (ZYRTEC) 10 MG tablet Take 10 mg by mouth daily.   Yes [provider]  Cyanocobalamin (VITAMIN B 12 PO) Take by mouth.   Yes [provider]  fluticasone (FLONASE) 50 MCG/ACT nasal spray Place 2 sprays into both nostrils daily. 05/05/17  Yes Domenick GongMortenson, Ashley, MD  liraglutide (VICTOZA) 18 MG/3ML SOPN Victoza 0.6 mg/0.1 mL (18 mg/3 mL) subcutaneous pen injector   1.8 mL every day by sub-q route.   Yes [provider]  lisinopril (PRINIVIL,ZESTRIL) 20 MG tablet Take 20 mg by mouth daily.   Yes [provider]  metFORMIN (GLUMETZA) 1000 MG (MOD) 24 hr tablet Take 1,000 mg by mouth daily with breakfast.   Yes [provider]  Multiple Vitamins-Minerals (ICAPS) CAPS Take by mouth.   Yes [provider]  Multiple Vitamins-Minerals  (PRESERVISION/LUTEIN PO) Take by mouth.   Yes [provider]  omeprazole (PRILOSEC) 20 MG capsule Take 20 mg by mouth daily.   Yes [provider]  omeprazole (PRILOSEC) 20 MG capsule omeprazole 20 mg tablet,delayed release   1 tablet every day by oral route.   Yes [provider]  oseltamivir (TAMIFLU) 75 MG capsule Take 1 capsule (75 mg total) by mouth 2 (two) times daily. X 5 days 05/05/17  Yes Domenick GongMortenson, Ashley, MD  sertraline (ZOLOFT) 50 MG tablet Take 50 mg by mouth daily.   Yes [provider]  Spacer/Aero-Holding Chambers (AEROCHAMBER PLUS) inhaler Use as instructed 05/05/17  Yes Domenick GongMortenson, Ashley, MD    Family History Family History  Problem Relation Age of Onset  . Breast cancer Sister 2939  . Breast cancer Cousin 60       2 pat cousins  . Hypertension Mother   . Melanoma Mother 2378  . Asthma Father   . Congestive Heart Failure Father   . COPD Father   . Diabetes Father   . Emphysema Father   . Hypertension Brother   . Sleep apnea Brother   . Colon cancer Maternal Grandmother     Social History Social History   Tobacco Use  . Smoking status: Never Smoker  . Smokeless tobacco: Never Used  Substance Use Topics  . Alcohol use: No  . Drug use: No     Allergies   Blue dyes (parenteral); Ciprofloxacin; and Hydrocodone   Review of Systems Review of Systems  Respiratory: Positive for chest tightness and shortness of breath.   Cardiovascular: Positive for chest pain.  Psychiatric/Behavioral:       Stress.   Physical Exam Triage Vital Signs ED Triage Vitals [10/31/17 1448]  Enc Vitals Group     BP      Pulse      Resp      Temp      Temp src      SpO2      Weight 221 lb (100.2 kg)     Height 5\' 5"  (1.651 m)     Head Circumference      Peak Flow      Pain Score 5     Pain Loc      Pain Edu?      Excl. in GC?    Updated Vital Signs Ht 5\' 5"  (1.651 m)   Wt 221 lb (100.2 kg)   BMI 36.78 kg/m   Visual Acuity Right  Eye Distance:   Left Eye Distance:   Bilateral Distance:    Right  Eye Near:   Left Eye Near:    Bilateral Near:     Physical Exam  Constitutional: She is oriented to person, place, and time. She appears well-developed. No distress.  HENT:  Head: Normocephalic and atraumatic.  Cardiovascular: Normal rate and regular rhythm.  Pulmonary/Chest: Effort normal and breath sounds normal. She has no wheezes. She has no rales. She exhibits tenderness.  Neurological: She is alert and oriented to person, place, and time.  Psychiatric: Her behavior is normal.  Anxious, tearful.  Nursing note and vitals reviewed.  UC Treatments / Results  Labs (all labs ordered are listed, but only abnormal results are displayed) Labs Reviewed - No data to display  EKG   Date: 10/31/2017  EKG Time: 3:54 PM  Rate: 84  Rhythm: Normal sinus rhythm.  PACs.  Axis: Normal Axis.   Intervals:none   ST&T Change: None.  Narrative Interpretation: Normal sinus rhythm with a rate of 84.  PACs noted.  No ST or T wave changes.  Nonischemic.  Radiology No results found.  Procedures Procedures (including critical care time)  Medications Ordered in UC Medications - No data to display  Initial Impression / Assessment and Plan / UC Course  I have reviewed the triage vital signs and the nursing notes.  Pertinent labs & imaging results that were available during my care of the patient were reviewed by me and considered in my medical decision making (see chart for details).    67 year old female presents with chest pain and shortness of breath.  EKG nonischemic.  However, given risk factors, I recommended hospital evaluation for serial cardiac enzymes.  Patient in agreement.  Going to the ER via private vehicle (she is stable at this time).  Final Clinical Impressions(s) / UC Diagnoses   Final diagnoses:  Chest pain, unspecified type  SOB (shortness of breath)     Discharge Instructions     Go the ER for  serial troponins.  Take care  Dr. Adriana Simas     ED Prescriptions    None     Controlled Substance Prescriptions [place] Controlled Substance Registry consulted? Not Applicable   Tommie Sams, DO 10/31/17 1555

## 2017-10-31 NOTE — ED Provider Notes (Signed)
[facility] Emergency Department Provider Note    First MD Initiated Contact with Patient 10/31/17 1635     (approximate)  I have reviewed the triage vital signs and the nursing notes.   HISTORY  Chief Complaint Chest Pain    HPI Miranda Pollard is a 67 y.o. female   with a history of hypertension presents with chief complaint of chest discomfort in level of her chest that radiates to her left arm.  Symptoms started on Thursday of last week.  States she has had some shortness of breath with this.  No hemoptysis.  No fevers.  No cough.  Denies any orthopnea or exertional dyspnea.  No diaphoresis.  Patient states that symptoms worsen after think about recent stressors.  She did have to recent family members die this past week.  Patient was seen at urgent care today for her chest discomfort.  Work-up and EKG otherwise were reassuring but due to patient's age and risk factors she is directed to the ER for further evaluation.  Denies any pain at this time.  Does feel anxious.   Past Medical History:  Diagnosis Date  . Apnea   . Asthma   . Calculus of kidney   . Diabetes mellitus without complication (HCC)   . Family history of breast cancer 03/04/2015   MyRisk NEG  . Genital herpes   . History of Papanicolaou smear of cervix 05/02/2016   NIL/NEG  . Hypertension   . Hypertension   . Macular degeneration (senile) of retina    Family History  Problem Relation Age of Onset  . Breast cancer Sister 6639  . Breast cancer Cousin 60       2 pat cousins  . Hypertension Mother   . Melanoma Mother [id number]  . Asthma Father   . Congestive Heart Failure Father   . COPD Father   . Diabetes Father   . Emphysema Father   . Hypertension Brother   . Sleep apnea Brother   . Colon cancer Maternal Grandmother    Past Surgical History:  Procedure Laterality Date  . CARPAL TUNNEL RELEASE  1980's  . CHOLECYSTECTOMY, LAPAROSCOPIC  2002  . COLONOSCOPY  07/2014  .  ESOPHAGOGASTRODUODENOSCOPY ENDOSCOPY  07/2014  . HYSTEROSCOPY  2004   cystic hyperplasia  . HYSTEROSCOPY  2008   postemopausa bleeding weakly proliferative endometrium on pathology  . LAPAROSCOPY  1993   Laparoscopy and laparotomy for "ovarian cysts and fibroid"   Patient Active Problem List   Diagnosis Date Noted  . Family history of breast cancer 09/07/2016      Prior to Admission medications   Medication Sig Start Date End Date Taking? Authorizing Provider  acetaminophen (TYLENOL) 650 MG CR tablet Take by mouth.    [provider]  albuterol (PROVENTIL HFA;VENTOLIN HFA) 108 (90 Base) MCG/ACT inhaler Inhale 2 puffs into the lungs every 4 (four) hours as needed for wheezing or shortness of breath. 05/05/17   Domenick GongMortenson, Ashley, MD  aspirin 81 MG chewable tablet Chew by mouth daily.    [provider]  aspirin EC 81 MG tablet Take 81 mg by mouth daily.    [provider]  benzonatate (TESSALON) 200 MG capsule Take 1 capsule (200 mg total) by mouth 3 (three) times daily as needed for cough. 05/05/17   Domenick GongMortenson, Ashley, MD  bevacizumab (AVASTIN) 1.25 mg/0.1 mL SOLN Inject into the vein.    [provider]  cetirizine (ZYRTEC) 10 MG tablet Take 10 mg  by mouth daily.    [provider]  Cyanocobalamin (VITAMIN B 12 PO) Take by mouth.    [provider]  fluticasone (FLONASE) 50 MCG/ACT nasal spray Place 2 sprays into both nostrils daily. 05/05/17   Domenick Gong, MD  liraglutide (VICTOZA) 18 MG/3ML SOPN Victoza 0.6 mg/0.1 mL (18 mg/3 mL) subcutaneous pen injector   1.8 mL every day by sub-q route.    [provider]  lisinopril (PRINIVIL,ZESTRIL) 20 MG tablet Take 20 mg by mouth daily.    [provider]  metFORMIN (GLUMETZA) 1000 MG (MOD) 24 hr tablet Take 1,000 mg by mouth daily with breakfast.    [provider]  Multiple Vitamins-Minerals (ICAPS) CAPS Take by mouth.    [provider]  Multiple  Vitamins-Minerals (PRESERVISION/LUTEIN PO) Take by mouth.    [provider]  omeprazole (PRILOSEC) 20 MG capsule Take 20 mg by mouth daily.    [provider]  omeprazole (PRILOSEC) 20 MG capsule omeprazole 20 mg tablet,delayed release   1 tablet every day by oral route.    [provider]  oseltamivir (TAMIFLU) 75 MG capsule Take 1 capsule (75 mg total) by mouth 2 (two) times daily. X 5 days 05/05/17   Domenick Gong, MD  sertraline (ZOLOFT) 50 MG tablet Take 50 mg by mouth daily.    [provider]  Spacer/Aero-Holding Chambers (AEROCHAMBER PLUS) inhaler Use as instructed 05/05/17   Domenick Gong, MD    Allergies Blue dyes (parenteral); Ciprofloxacin; and Hydrocodone    Social History Social History   Tobacco Use  . Smoking status: Never Smoker  . Smokeless tobacco: Never Used  Substance Use Topics  . Alcohol use: No  . Drug use: No    Review of Systems Patient denies headaches, rhinorrhea, blurry vision, numbness, shortness of breath, chest pain, edema, cough, abdominal pain, nausea, vomiting, diarrhea, dysuria, fevers, rashes or hallucinations unless otherwise stated above in HPI. ____________________________________________   PHYSICAL EXAM:  VITAL SIGNS: Vitals:   10/31/17 1622  BP: 138/86  Pulse: 96  Resp: 18  Temp: 98.3 F (36.8 C)  SpO2: 98%    Constitutional: Alert and oriented.  Eyes: Conjunctivae are normal.  Head: Atraumatic. Nose: No congestion/rhinnorhea. Mouth/Throat: Mucous membranes are moist.   Neck: No stridor. Painless ROM.  Cardiovascular: Normal rate, regular rhythm. Grossly normal heart sounds.  Good peripheral circulation. Respiratory: Normal respiratory effort.  No retractions. Lungs CTAB. Gastrointestinal: Soft and nontender. No distention. No abdominal bruits. No CVA tenderness. Genitourinary:  Musculoskeletal: No lower extremity tenderness nor edema.  No joint effusions. Neurologic:  Normal  speech and language. No gross focal neurologic deficits are appreciated. No facial droop Skin:  Skin is warm, dry and intact. No rash noted. Psychiatric: Mood and affect are normal. Speech and behavior are normal.  ____________________________________________   LABS (all labs ordered are listed, but only abnormal results are displayed)  Results for orders placed or performed during the hospital encounter of 10/31/17 (from the past 24 hour(s))  CBC     Status: Abnormal   Collection Time: 10/31/17  4:26 PM  Result Value Ref Range   WBC 11.5 (H) 3.6 - 11.0 K/uL   RBC 5.38 (H) 3.80 - 5.20 MIL/uL   Hemoglobin 14.6 12.0 - 16.0 g/dL   HCT 16.1 09.6 - 04.5 %   MCV 82.4 80.0 - 100.0 fL   MCH 27.0 26.0 - 34.0 pg   MCHC 32.8 32.0 - 36.0 g/dL   RDW 40.9 (H) 81.1 -  14.5 %   Platelets 322 150 - 440 K/uL   ____________________________________________  EKG My review and personal interpretation at Time: 16:21   Indication: chest pain  Rate: 99  Rhythm: sinus Axis: normal Other: normal intevals, no stemi, nonspecific st and t wave abn ____________________________________________  RADIOLOGY   ____________________________________________   PROCEDURES  Procedure(s) performed:  Procedures    Critical Care performed: no ____________________________________________   INITIAL IMPRESSION / ASSESSMENT AND PLAN / ED COURSE  Pertinent labs & imaging results that were available during my care of the patient were reviewed by me and considered in my medical decision making (see chart for details).   DDX: ACS, pericarditis, esophagitis, boerhaaves, pe, dissection, pna, bronchitis, costochondritis   AHRI OLSON is a 67 y.o. who presents to the ED with symptoms as described above.  Seems fairly atypical for ACS.  EKG shows no acute ischemic changes.  Initial troponin is negative.  Vital signs are stable.  She has no hypoxia.  She is low risk by [place] but will order d-dimer to further risk  stratify for PE.  Chest x-ray shows no evidence of consolidation or pneumothorax  Clinical Course as of Oct 31 2140  Tue Oct 31, 2017  1856 D-dimer is negative.  Patient symptomatology improving.  Will continue to observe for repeat troponin.   [PR]    Clinical Course User Index [PR] Willy Eddy, MD   ----------------------------------------- 9:44 PM on 10/31/2017 ----------------------------------------- Troponin negative.  Patient pain-free.  No dysrhythmia on monitor.  At this point believe patient stable and appropriate for outpatient follow-up and referral to cardiology.  No signs of myocardial injury on work-up thus far.  Have discussed with the patient and available family all diagnostics and treatments performed thus far and all questions were answered to the best of my ability. The patient demonstrates understanding and agreement with plan.    As part of my medical decision making, I reviewed the following data within the electronic MEDICAL RECORD NUMBER Nursing notes reviewed and incorporated, Labs reviewed, notes from prior ED visits.   ____________________________________________   FINAL CLINICAL IMPRESSION(S) / ED DIAGNOSES  Final diagnoses:  Chest pain, unspecified type      NEW MEDICATIONS STARTED DURING THIS VISIT:  New Prescriptions   No medications on file     Note:  This document was prepared using Dragon voice recognition software and may include unintentional dictation errors.    Willy Eddy, MD 10/31/17 2146

## 2017-10-31 NOTE — ED Triage Notes (Signed)
Patient c/o shortness of breath, chest pain and dizziness x 1 on Thursday night. Episode resolved. This morning she reports shortness of breath and chest pain.

## 2017-10-31 NOTE — ED Triage Notes (Signed)
[facility] with complaints of chest pain that started this morning. Pt states the pain starts in the center of her chest and radiates to her left arm. Pt reports she had a "onset" of chest pain episodes starting last Thursday. Pt reports intermittent episodes of chest pain since but states "they only last for a short time." Pt reports episode where she felt like she couldn't catch her breath but reports that sensation can stopped.

## 2017-12-31 DIAGNOSIS — R079 Chest pain, unspecified: Secondary | ICD-10-CM | POA: Insufficient documentation

## 2017-12-31 DIAGNOSIS — F4320 Adjustment disorder, unspecified: Secondary | ICD-10-CM | POA: Insufficient documentation

## 2017-12-31 NOTE — Progress Notes (Signed)
Cardiology Office Note  Date:  01/02/2018   ID:  Glenice, Ciccone [birth year], MRN [id number]  PCP:  Marina Goodell, MD   Chief Complaint  Patient presents with  . other    F/u ED chest pain no complaints today. Meds reviewed verbally with pt.     HPI:  [name] is a 67 year old woman with past medical history of Hypertension Apnea Diabetes type 2, poorly controlled hyperlipidemia Asthma 2009 for positive stress test, anterior wall Cardiac catheterization April 18, 2007 with no coronary disease  referred by Dr. Willy Eddy for consultation of her chest pain   Recent episodes of chest pain Recent evaluation in the emergency room Thinks it was stress, family Chest pressure going to left arm Recent symptoms lasted 4 to 5 hours W/u negative emergency room Hospital records reviewed with the patient in detail D-dimer negative and cardiac enzymes negative symptoms worsen after think about recent stressors.  She did have to recent family members die this past week.  Sees dr. Gershon Crane, started on new medication for diabetes  Not much chest pain since then, One faint episode of chest discomfort Sees Dr. Maryruth Bun for stress/anxiety Able to ambulate without reproducible chest pain  some shortness of breath  No hemoptysis.  No fevers.  No cough.  Denies any orthopnea or exertional dyspnea.  No diaphoresis.   EKG personally reviewed by myself on todays visit Shows normal sinus rhythm no significant ST or T wave changes rate 68 bpm rare PVC  PMH:   has a past medical history of Apnea, Asthma, Calculus of kidney, Diabetes mellitus without complication (HCC), Family history of breast cancer (03/04/2015), Genital herpes, History of Papanicolaou smear of cervix (05/02/2016), Hypertension, Hypertension, and Macular degeneration (senile) of retina.  PSH:    Past Surgical History:  Procedure Laterality Date  . CARDIAC CATHETERIZATION  05/2007   No stents [facility]  .  CARPAL TUNNEL RELEASE  1980's  . CHOLECYSTECTOMY, LAPAROSCOPIC  2002  . COLONOSCOPY  07/2014  . ESOPHAGOGASTRODUODENOSCOPY ENDOSCOPY  07/2014  . HYSTEROSCOPY  2004   cystic hyperplasia  . HYSTEROSCOPY  2008   postemopausa bleeding weakly proliferative endometrium on pathology  . LAPAROSCOPY  1993   Laparoscopy and laparotomy for "ovarian cysts and fibroid"    Current Outpatient Medications  Medication Sig Dispense Refill  . acetaminophen (TYLENOL) 650 MG CR tablet Take by mouth as needed.     Marland Kitchen albuterol (PROVENTIL HFA;VENTOLIN HFA) 108 (90 Base) MCG/ACT inhaler Inhale 2 puffs into the lungs every 4 (four) hours as needed for wheezing or shortness of breath. 1 Inhaler 0  . aspirin EC 81 MG tablet Take 81 mg by mouth daily.    Marland Kitchen atorvastatin (LIPITOR) 10 MG tablet Take 10 mg by mouth daily.    . benzonatate (TESSALON) 200 MG capsule Take 1 capsule (200 mg total) by mouth 3 (three) times daily as needed for cough. 30 capsule 0  . bevacizumab (AVASTIN) 1.25 mg/0.1 mL SOLN Every 13-14 wks right eye.    . cetirizine (ZYRTEC) 10 MG tablet Take 10 mg by mouth daily.    . Cyanocobalamin (VITAMIN B 12 PO) Take by mouth.    . fluticasone (FLONASE) 50 MCG/ACT nasal spray Place 2 sprays into both nostrils daily. 16 g 0  . glimepiride (AMARYL) 1 MG tablet Take 1 mg by mouth 2 (two) times daily.    . INSULIN DEGLUDEC [place] Inject 20 Units into the skin daily.    Marland Kitchen liraglutide (VICTOZA) 18  MG/3ML SOPN Victoza 0.6 mg/0.1 mL (18 mg/3 mL) subcutaneous pen injector   1.8 mL every day by sub-q route.    Marland Kitchen. lisinopril (PRINIVIL,ZESTRIL) 20 MG tablet Take 20 mg by mouth daily.    . metFORMIN (GLUMETZA) 1000 MG (MOD) 24 hr tablet Take 1,000 mg by mouth 2 (two) times daily with a meal.     . Multiple Vitamins-Minerals (ICAPS AREDS 2 PO) Take by mouth daily.    . Multiple Vitamins-Minerals (ICAPS) CAPS Take by mouth.    Marland Kitchen. omeprazole (PRILOSEC) 20 MG capsule Take 20 mg by mouth daily.    . pioglitazone (ACTOS) 15  MG tablet Take 15 mg by mouth daily.    . sertraline (ZOLOFT) 50 MG tablet Take 50 mg by mouth daily.     No current facility-administered medications for this visit.      Allergies:   Blue dyes (parenteral); Ciprofloxacin; and Hydrocodone   Social History:  The patient  reports that she has never smoked. She has never used smokeless tobacco. She reports that she does not drink alcohol or use drugs.   Family History:   family history includes Asthma in her father; Breast cancer (age of onset: 3139) in her sister; Breast cancer (age of onset: 7060) in her cousin; COPD in her father; Colon cancer in her maternal grandmother; Congestive Heart Failure in her father; Diabetes in her father; Emphysema in her father; Heart Problems in her mother; Heart attack in her maternal grandfather and maternal uncle; Hypertension in her brother and mother; Melanoma (age of onset: 7678) in her mother; Sleep apnea in her brother.    Review of Systems: Review of Systems  Constitutional: Negative.   Respiratory: Negative.   Cardiovascular: Positive for chest pain.       Left arm pain  Gastrointestinal: Negative.   Musculoskeletal: Negative.   Neurological: Negative.   Psychiatric/Behavioral: Negative.   All other systems reviewed and are negative.    PHYSICAL EXAM: VS:  BP 133/76 (BP Location: Right Arm, Patient Position: Sitting, Cuff Size: Normal)   Pulse 76   Ht 5\' 5"  (1.651 m)   Wt 241 lb 12 oz (109.7 kg)   BMI 40.23 kg/m   , BMI Body mass index is 40.23 kg/m. GEN: Well nourished, well developed, in no acute distress  HEENT: normal  Neck: no JVD, carotid bruits, or masses Cardiac: RRR; no murmurs, rubs, or gallops,no edema  Respiratory:  clear to auscultation bilaterally, normal work of breathing GI: soft, nontender, nondistended, + BS MS: no deformity or atrophy  Skin: warm and dry, no rash Neuro:  Strength and sensation are intact Psych: euthymic mood, full affect   Recent Labs: 10/31/2017:  BUN 13; Creatinine, Ser 0.54; Hemoglobin 14.6; Platelets 322; Potassium 3.3; Sodium 139; TSH 0.516    Lipid Panel No results found for: CHOL, HDL, LDLCALC, TRIG    Wt Readings from Last 3 Encounters:  01/02/18 241 lb 12 oz (109.7 kg)  10/31/17 221 lb (100.2 kg)  10/31/17 221 lb (100.2 kg)       ASSESSMENT AND PLAN:  Type 2 diabetes mellitus with complication, without long-term current use of insulin (HCC) We have encouraged continued exercise, careful diet management in an effort to lose weight. Managed by endocrinology Long history of poorly controlled diabetes Poor diet  Chest pain, unspecified type - Plan: CT CARDIAC SCORING Atypical features Sounding more musculoskeletal but she does have risk factors Unable to treadmill Discussed various treatment options with her including nuclear stress test  versus coronary calcium scoring She had previous stress test that showed perfusion defect leading to cardiac catheterization.  We have requested previous cardiac catheterization results from 10 years ago She is high risk of breast attenuation artifact For now we will order calcium scoring for risk stratification  Adjustment disorder with anxious mood Managed by Dr. Maryruth Bun.  Possibly contributing to recent symptoms  Morbid obesity (HCC) Recommended low carbohydrate diet, better diet in general, walking program  Mixed hyperlipidemia Managed by primary care Goal LDL 70 given high risk diabetes  Disposition:   F/U as needed  Records requested and reviewed, including primary care records, emergency room records, prior stress test, cardiac catheterization  Total encounter time more than 60 minutes  Greater than 50% was spent in counseling and coordination of care with the patient    Orders Placed This Encounter  Procedures  . CT CARDIAC SCORING     Signed, Dossie Arbour, M.D., Ph.D. 01/02/2018  [facility] [place], [place] [phone]

## 2018-01-02 ENCOUNTER — Encounter: Payer: Self-pay | Admitting: Cardiovascular Disease

## 2018-01-02 ENCOUNTER — Ambulatory Visit: Payer: BLUE CROSS/BLUE SHIELD | Admitting: Cardiovascular Disease

## 2018-01-02 VITALS — BP 133/76 | HR 76 | Ht 65.0 in | Wt 241.8 lb

## 2018-01-02 DIAGNOSIS — F4322 Adjustment disorder with anxiety: Secondary | ICD-10-CM

## 2018-01-02 DIAGNOSIS — E118 Type 2 diabetes mellitus with unspecified complications: Secondary | ICD-10-CM | POA: Diagnosis not present

## 2018-01-02 DIAGNOSIS — E785 Hyperlipidemia, unspecified: Secondary | ICD-10-CM | POA: Insufficient documentation

## 2018-01-02 DIAGNOSIS — E782 Mixed hyperlipidemia: Secondary | ICD-10-CM | POA: Diagnosis not present

## 2018-01-02 DIAGNOSIS — R079 Chest pain, unspecified: Secondary | ICD-10-CM | POA: Diagnosis not present

## 2018-01-02 NOTE — Patient Instructions (Addendum)
Medication Instructions:   No medication changes made  Labwork:  No new labs needed  Testing/Procedures:  We will order CT coronary calcium score $150 Family History   Please call [phone] to schedule  [facility] [address] [place], [place] [address]   Follow-Up: It was a pleasure seeing you in the office today. Please call us if you have new issues that need to be addressed before your next appt.  [phone]  Your physician wants you to follow-up in:  As needed  If you need a refill on your cardiac medications before your next appointment, please call your pharmacy.  For educational health videos Log in to : [email or web address] Or : [email or web address], password : [email or web address]  

## 2018-01-08 ENCOUNTER — Ambulatory Visit (INDEPENDENT_AMBULATORY_CARE_PROVIDER_SITE_OTHER)
Admission: RE | Admit: 2018-01-08 | Discharge: 2018-01-08 | Disposition: A | Discharge status: Home / Self Care | Payer: BLUE CROSS/BLUE SHIELD | Source: Ambulatory Visit | Attending: Cardiovascular Disease | Admitting: Cardiovascular Disease

## 2018-01-08 DIAGNOSIS — R079 Chest pain, unspecified: Secondary | ICD-10-CM

## 2018-01-08 NOTE — Addendum Note (Signed)
Addended by: Kendrick Fries on: 01/08/2018 07:20 AM   Modules accepted: Orders

## 2018-03-16 DIAGNOSIS — E113293 Type 2 diabetes mellitus with mild nonproliferative diabetic retinopathy without macular edema, bilateral: Secondary | ICD-10-CM | POA: Diagnosis not present

## 2018-07-02 ENCOUNTER — Encounter: Admission: RE | Payer: Self-pay | Source: Home / Self Care

## 2018-07-02 ENCOUNTER — Ambulatory Visit: Admission: RE | Admit: 2018-07-02 | Payer: Medicare Other | Source: Home / Self Care | Admitting: Ophthalmology

## 2018-07-02 SURGERY — PHACOEMULSIFICATION, CATARACT, WITH IOL INSERTION
Anesthesia: Topical | Laterality: Left

## 2018-07-11 ENCOUNTER — Encounter: Admission: RE | Payer: Self-pay | Source: Home / Self Care

## 2018-07-11 ENCOUNTER — Ambulatory Visit: Admission: RE | Admit: 2018-07-11 | Payer: Medicare Other | Source: Home / Self Care | Admitting: Ophthalmology

## 2018-07-11 SURGERY — PHACOEMULSIFICATION, CATARACT, WITH IOL INSERTION
Anesthesia: Choice | Laterality: Left

## 2018-08-23 ENCOUNTER — Other Ambulatory Visit
Admission: RE | Admit: 2018-08-23 | Discharge: 2018-08-23 | Disposition: A | Discharge status: Home / Self Care | Payer: Medicare Other | Source: Ambulatory Visit | Attending: Ophthalmology | Admitting: Ophthalmology

## 2018-08-23 ENCOUNTER — Encounter: Payer: Self-pay | Admitting: *Deleted

## 2018-08-23 ENCOUNTER — Other Ambulatory Visit: Payer: Self-pay

## 2018-08-23 DIAGNOSIS — Z1159 Encounter for screening for other viral diseases: Secondary | ICD-10-CM | POA: Diagnosis present

## 2018-08-24 LAB — NOVEL CORONAVIRUS, NAA (HOSP ORDER, SEND-OUT TO REF LAB; TAT 18-24 HRS): SARS-CoV-2, NAA: NOT DETECTED

## 2018-08-24 NOTE — Discharge Instructions (Signed)

## 2018-08-27 ENCOUNTER — Ambulatory Visit: Payer: Medicare Other | Admitting: Anesthesiology

## 2018-08-27 ENCOUNTER — Encounter
Admission: RE | Disposition: A | Discharge status: Home / Self Care | Payer: Self-pay | Source: Home / Self Care | Attending: Ophthalmology

## 2018-08-27 ENCOUNTER — Ambulatory Visit
Admission: RE | Admit: 2018-08-27 | Discharge: 2018-08-27 | Disposition: A | Discharge status: Home / Self Care | Payer: Medicare Other | Attending: Ophthalmology | Admitting: Ophthalmology

## 2018-08-27 DIAGNOSIS — F329 Major depressive disorder, single episode, unspecified: Secondary | ICD-10-CM | POA: Insufficient documentation

## 2018-08-27 DIAGNOSIS — E785 Hyperlipidemia, unspecified: Secondary | ICD-10-CM | POA: Insufficient documentation

## 2018-08-27 DIAGNOSIS — Z881 Allergy status to other antibiotic agents status: Secondary | ICD-10-CM | POA: Diagnosis not present

## 2018-08-27 DIAGNOSIS — H2512 Age-related nuclear cataract, left eye: Secondary | ICD-10-CM | POA: Insufficient documentation

## 2018-08-27 DIAGNOSIS — J45909 Unspecified asthma, uncomplicated: Secondary | ICD-10-CM | POA: Insufficient documentation

## 2018-08-27 DIAGNOSIS — I10 Essential (primary) hypertension: Secondary | ICD-10-CM | POA: Insufficient documentation

## 2018-08-27 DIAGNOSIS — F419 Anxiety disorder, unspecified: Secondary | ICD-10-CM | POA: Insufficient documentation

## 2018-08-27 DIAGNOSIS — K219 Gastro-esophageal reflux disease without esophagitis: Secondary | ICD-10-CM | POA: Diagnosis not present

## 2018-08-27 DIAGNOSIS — Z794 Long term (current) use of insulin: Secondary | ICD-10-CM | POA: Insufficient documentation

## 2018-08-27 DIAGNOSIS — Z79899 Other long term (current) drug therapy: Secondary | ICD-10-CM | POA: Insufficient documentation

## 2018-08-27 DIAGNOSIS — Z7982 Long term (current) use of aspirin: Secondary | ICD-10-CM | POA: Diagnosis not present

## 2018-08-27 DIAGNOSIS — G4730 Sleep apnea, unspecified: Secondary | ICD-10-CM | POA: Diagnosis not present

## 2018-08-27 DIAGNOSIS — Z6841 Body Mass Index (BMI) 40.0 and over, adult: Secondary | ICD-10-CM | POA: Diagnosis not present

## 2018-08-27 DIAGNOSIS — Z885 Allergy status to narcotic agent status: Secondary | ICD-10-CM | POA: Insufficient documentation

## 2018-08-27 DIAGNOSIS — M1990 Unspecified osteoarthritis, unspecified site: Secondary | ICD-10-CM | POA: Diagnosis not present

## 2018-08-27 DIAGNOSIS — E1136 Type 2 diabetes mellitus with diabetic cataract: Secondary | ICD-10-CM | POA: Diagnosis not present

## 2018-08-27 HISTORY — DX: Unspecified osteoarthritis, unspecified site: M19.90

## 2018-08-27 HISTORY — DX: Sleep apnea, unspecified: G47.30

## 2018-08-27 HISTORY — DX: Gastro-esophageal reflux disease without esophagitis: K21.9

## 2018-08-27 HISTORY — DX: Motion sickness, initial encounter: T75.3XXA

## 2018-08-27 HISTORY — PX: CATARACT EXTRACTION W/PHACO: SHX586

## 2018-08-27 LAB — GLUCOSE, CAPILLARY: Glucose-Capillary: 133 mg/dL — ABNORMAL HIGH (ref 70–99)

## 2018-08-27 SURGERY — PHACOEMULSIFICATION, CATARACT, WITH IOL INSERTION
Anesthesia: Monitor Anesthesia Care | Site: Eye | Laterality: Left

## 2018-08-27 MED ORDER — SODIUM HYALURONATE 10 MG/ML IO SOLN
INTRAOCULAR | Status: DC | PRN
  Administered 2018-08-27: 0.55 mL via INTRAOCULAR

## 2018-08-27 MED ORDER — MIDAZOLAM HCL 2 MG/2ML IJ SOLN
INTRAMUSCULAR | Status: DC | PRN
  Administered 2018-08-27: 2 mg via INTRAVENOUS

## 2018-08-27 MED ORDER — ARMC OPHTHALMIC DILATING DROPS
1.0000 "application " | OPHTHALMIC | Status: DC | PRN
  Administered 2018-08-27 (×3): 1 via OPHTHALMIC

## 2018-08-27 MED ORDER — TETRACAINE HCL 0.5 % OP SOLN
1.0000 [drp] | OPHTHALMIC | Status: DC | PRN
  Administered 2018-08-27 (×3): 1 [drp] via OPHTHALMIC

## 2018-08-27 MED ORDER — SODIUM HYALURONATE 23 MG/ML IO SOLN
INTRAOCULAR | Status: DC | PRN
  Administered 2018-08-27: 0.6 mL via INTRAOCULAR

## 2018-08-27 MED ORDER — MOXIFLOXACIN HCL 0.5 % OP SOLN
OPHTHALMIC | Status: DC | PRN
  Administered 2018-08-27: 0.2 mL via OPHTHALMIC

## 2018-08-27 MED ORDER — EPINEPHRINE PF 1 MG/ML IJ SOLN
INTRAOCULAR | Status: DC | PRN
  Administered 2018-08-27: 10:00:00 48 mL via OPHTHALMIC

## 2018-08-27 MED ORDER — LIDOCAINE HCL (PF) 2 % IJ SOLN
INTRAOCULAR | Status: DC | PRN
  Administered 2018-08-27: 2 mL via INTRAOCULAR

## 2018-08-27 MED ORDER — FENTANYL CITRATE (PF) 100 MCG/2ML IJ SOLN
INTRAMUSCULAR | Status: DC | PRN
  Administered 2018-08-27: 50 ug via INTRAVENOUS

## 2018-08-27 MED ORDER — LACTATED RINGERS IV SOLN: 10.0000 mL/h | INTRAVENOUS | Status: DC

## 2018-08-27 SURGICAL SUPPLY — 19 items
CANNULA ANT/CHMB 27G (MISCELLANEOUS) ×2 IMPLANT
CANNULA ANT/CHMB 27GA (MISCELLANEOUS) ×6 IMPLANT
DISSECTOR HYDRO NUCLEUS 50X22 (MISCELLANEOUS) ×3 IMPLANT
GLOVE SURG LX 7.5 STRW (GLOVE) ×2
GLOVE SURG LX STRL 7.5 STRW (GLOVE) ×1 IMPLANT
GLOVE SURG SYN 8.5  E (GLOVE) ×2
GLOVE SURG SYN 8.5 E (GLOVE) ×1 IMPLANT
GLOVE SURG SYN 8.5 PF PI (GLOVE) ×1 IMPLANT
GOWN STRL REUS W/ TWL LRG LVL3 (GOWN DISPOSABLE) ×2 IMPLANT
GOWN STRL REUS W/TWL LRG LVL3 (GOWN DISPOSABLE) ×6
LENS IOL TECNIS ITEC 25.0 (Intraocular Lens) ×2 IMPLANT
MARKER SKIN DUAL TIP RULER LAB (MISCELLANEOUS) ×3 IMPLANT
PACK DR. KING ARMS (PACKS) ×3 IMPLANT
PACK EYE AFTER SURG (MISCELLANEOUS) ×3 IMPLANT
PACK OPTHALMIC (MISCELLANEOUS) ×3 IMPLANT
SYR 3ML LL SCALE MARK (SYRINGE) ×3 IMPLANT
SYR TB 1ML LUER SLIP (SYRINGE) ×3 IMPLANT
WATER STERILE IRR 500ML POUR (IV SOLUTION) ×3 IMPLANT
WIPE NON LINTING 3.25X3.25 (MISCELLANEOUS) ×3 IMPLANT

## 2018-08-27 NOTE — Op Note (Signed)
OPERATIVE NOTE  Miranda Pollard [id number] 08/27/2018   PREOPERATIVE DIAGNOSIS:  Nuclear sclerotic cataract left eye.  H25.12   POSTOPERATIVE DIAGNOSIS:    Nuclear sclerotic cataract left eye.     PROCEDURE:  Phacoemusification with posterior chamber intraocular lens placement of the left eye   LENS:   Implant Name Type Inv. Item Serial No. Manufacturer Lot No. LRB No. Used  LENS IOL DIOP 25.0 - [id number] Intraocular Lens LENS IOL DIOP 25.0 [id number] AMO  Left 1       PCB00 +25.0   ULTRASOUND TIME: 0 minutes 41 seconds.  CDE 4.81   SURGEON:  Willey Blade, MD, MPH   ANESTHESIA:  Topical with tetracaine drops augmented with 1% preservative-free intracameral lidocaine.  ESTIMATED BLOOD LOSS: <1 mL   COMPLICATIONS:  None.   DESCRIPTION OF PROCEDURE:  The patient was identified in the holding room and transported to the operating room and placed in the supine position under the operating microscope.  The left eye was identified as the operative eye and it was prepped and draped in the usual sterile ophthalmic fashion.   A 1.0 millimeter clear-corneal paracentesis was made at the 5:00 position. 0.5 ml of preservative-free 1% lidocaine with epinephrine was injected into the anterior chamber.  The anterior chamber was filled with Healon 5 viscoelastic.  A 2.4 millimeter keratome was used to make a near-clear corneal incision at the 2:00 position.  A curvilinear capsulorrhexis was made with a cystotome and capsulorrhexis forceps.  Balanced salt solution was used to hydrodissect and hydrodelineate the nucleus.   Phacoemulsification was then used in stop and chop fashion to remove the lens nucleus and epinucleus.  The remaining cortex was then removed using the irrigation and aspiration handpiece. Healon was then placed into the capsular bag to distend it for lens placement.  A lens was then injected into the capsular bag.  The remaining viscoelastic was aspirated.   Wounds were hydrated  with balanced salt solution.  The anterior chamber was inflated to a physiologic pressure with balanced salt solution.  Intracameral vigamox 0.1 mL undiltued was injected into the eye and a drop placed onto the ocular surface.  No wound leaks were noted.  The patient was taken to the recovery room in stable condition without complications of anesthesia or surgery  Willey Blade 08/27/2018, 9:54 AM

## 2018-08-27 NOTE — Transfer of Care (Signed)
Immediate Anesthesia Transfer of Care Note  Patient: Miranda Pollard  Procedure(s) Performed: CATARACT EXTRACTION PHACO AND INTRAOCULAR LENS PLACEMENT (IOC)  LEFT DIABETIC (Left Eye)  Patient Location: PACU  Anesthesia Type: MAC  Level of Consciousness: awake, alert  and patient cooperative  Airway and Oxygen Therapy: Patient Spontanous Breathing and Patient connected to supplemental oxygen  Post-op Assessment: Post-op Vital signs reviewed, Patient's Cardiovascular Status Stable, Respiratory Function Stable, Patent Airway and No signs of Nausea or vomiting  Post-op Vital Signs: Reviewed and stable  Complications: No apparent anesthesia complications

## 2018-08-27 NOTE — H&P (Signed)

## 2018-08-27 NOTE — Anesthesia Postprocedure Evaluation (Signed)
Anesthesia Post Note  Patient: Miranda Pollard  Procedure(s) Performed: CATARACT EXTRACTION PHACO AND INTRAOCULAR LENS PLACEMENT (IOC)  LEFT DIABETIC (Left Eye)  Patient location during evaluation: PACU Anesthesia Type: MAC Level of consciousness: awake and alert Pain management: pain level controlled Vital Signs Assessment: post-procedure vital signs reviewed and stable Respiratory status: spontaneous breathing, nonlabored ventilation, respiratory function stable and patient connected to nasal cannula oxygen Cardiovascular status: stable and blood pressure returned to baseline Postop Assessment: no apparent nausea or vomiting Anesthetic complications: no    Veda Canning

## 2018-08-27 NOTE — Anesthesia Procedure Notes (Signed)
Procedure Name: MAC Date/Time: 08/27/2018 9:42 AM Performed by: Cameron Ali, CRNA Pre-anesthesia Checklist: Patient identified, Emergency Drugs available, Suction available, Timeout performed and Patient being monitored Patient Re-evaluated:Patient Re-evaluated prior to induction Oxygen Delivery Method: Nasal cannula Placement Confirmation: positive ETCO2

## 2018-08-27 NOTE — Anesthesia Preprocedure Evaluation (Signed)
Anesthesia Evaluation  Patient identified by MRN, date of birth, ID band Patient awake    Reviewed: Allergy & Precautions, NPO status , Patient's Chart, lab work & pertinent test results  Airway Mallampati: II  TM Distance: >3 FB     Dental   Pulmonary asthma , sleep apnea ,    breath sounds clear to auscultation       Cardiovascular hypertension,  Rhythm:Regular Rate:Normal  HLD   Neuro/Psych    GI/Hepatic GERD  ,  Endo/Other  diabetesMorbid obesity (BMI 43)  Renal/GU      Musculoskeletal   Abdominal   Peds  Hematology   Anesthesia Other Findings   Reproductive/Obstetrics                             Anesthesia Physical Anesthesia Plan  ASA: III  Anesthesia Plan: MAC   Post-op Pain Management:    Induction: Intravenous  PONV Risk Score and Plan:   Airway Management Planned: Nasal Cannula  Additional Equipment:   Intra-op Plan:   Post-operative Plan:   Informed Consent: I have reviewed the patients History and Physical, chart, labs and discussed the procedure including the risks, benefits and alternatives for the proposed anesthesia with the patient or authorized representative who has indicated his/her understanding and acceptance.       Plan Discussed with: CRNA  Anesthesia Plan Comments:         Anesthesia Quick Evaluation

## 2018-08-28 ENCOUNTER — Encounter: Payer: Self-pay | Admitting: Ophthalmology

## 2018-09-18 ENCOUNTER — Encounter: Payer: Self-pay | Admitting: *Deleted

## 2018-09-18 ENCOUNTER — Other Ambulatory Visit: Payer: Self-pay

## 2018-09-20 ENCOUNTER — Encounter
Admission: RE | Admit: 2018-09-20 | Discharge: 2018-09-20 | Disposition: A | Discharge status: Home / Self Care | Payer: Medicare Other | Source: Ambulatory Visit | Attending: Ophthalmology | Admitting: Ophthalmology

## 2018-09-20 ENCOUNTER — Other Ambulatory Visit: Payer: Self-pay

## 2018-09-20 DIAGNOSIS — Z1159 Encounter for screening for other viral diseases: Secondary | ICD-10-CM | POA: Insufficient documentation

## 2018-09-20 NOTE — Discharge Instructions (Signed)

## 2018-09-21 LAB — NOVEL CORONAVIRUS, NAA (HOSP ORDER, SEND-OUT TO REF LAB; TAT 18-24 HRS): SARS-CoV-2, NAA: NOT DETECTED

## 2018-09-24 ENCOUNTER — Ambulatory Visit: Payer: Medicare Other | Admitting: Anesthesiology

## 2018-09-24 ENCOUNTER — Ambulatory Visit
Admission: RE | Admit: 2018-09-24 | Discharge: 2018-09-24 | Disposition: A | Discharge status: Home / Self Care | Payer: Medicare Other | Attending: Ophthalmology | Admitting: Ophthalmology

## 2018-09-24 ENCOUNTER — Encounter
Admission: RE | Disposition: A | Discharge status: Home / Self Care | Payer: Self-pay | Source: Home / Self Care | Attending: Ophthalmology

## 2018-09-24 ENCOUNTER — Other Ambulatory Visit: Payer: Self-pay

## 2018-09-24 DIAGNOSIS — Z79899 Other long term (current) drug therapy: Secondary | ICD-10-CM | POA: Diagnosis not present

## 2018-09-24 DIAGNOSIS — H2511 Age-related nuclear cataract, right eye: Secondary | ICD-10-CM | POA: Insufficient documentation

## 2018-09-24 DIAGNOSIS — J45909 Unspecified asthma, uncomplicated: Secondary | ICD-10-CM | POA: Diagnosis not present

## 2018-09-24 DIAGNOSIS — K219 Gastro-esophageal reflux disease without esophagitis: Secondary | ICD-10-CM | POA: Insufficient documentation

## 2018-09-24 DIAGNOSIS — M1990 Unspecified osteoarthritis, unspecified site: Secondary | ICD-10-CM | POA: Diagnosis not present

## 2018-09-24 DIAGNOSIS — Z791 Long term (current) use of non-steroidal anti-inflammatories (NSAID): Secondary | ICD-10-CM | POA: Insufficient documentation

## 2018-09-24 DIAGNOSIS — Z885 Allergy status to narcotic agent status: Secondary | ICD-10-CM | POA: Insufficient documentation

## 2018-09-24 DIAGNOSIS — I10 Essential (primary) hypertension: Secondary | ICD-10-CM | POA: Insufficient documentation

## 2018-09-24 DIAGNOSIS — Z7982 Long term (current) use of aspirin: Secondary | ICD-10-CM | POA: Diagnosis not present

## 2018-09-24 DIAGNOSIS — Z881 Allergy status to other antibiotic agents status: Secondary | ICD-10-CM | POA: Diagnosis not present

## 2018-09-24 DIAGNOSIS — G4730 Sleep apnea, unspecified: Secondary | ICD-10-CM | POA: Diagnosis not present

## 2018-09-24 DIAGNOSIS — F329 Major depressive disorder, single episode, unspecified: Secondary | ICD-10-CM | POA: Diagnosis not present

## 2018-09-24 DIAGNOSIS — E1136 Type 2 diabetes mellitus with diabetic cataract: Secondary | ICD-10-CM | POA: Diagnosis not present

## 2018-09-24 DIAGNOSIS — Z7984 Long term (current) use of oral hypoglycemic drugs: Secondary | ICD-10-CM | POA: Insufficient documentation

## 2018-09-24 DIAGNOSIS — F419 Anxiety disorder, unspecified: Secondary | ICD-10-CM | POA: Diagnosis not present

## 2018-09-24 HISTORY — PX: CATARACT EXTRACTION W/PHACO: SHX586

## 2018-09-24 LAB — GLUCOSE, CAPILLARY
Glucose-Capillary: 134 mg/dL — ABNORMAL HIGH (ref 70–99)
Glucose-Capillary: 132 mg/dL — ABNORMAL HIGH (ref 70–99)

## 2018-09-24 SURGERY — PHACOEMULSIFICATION, CATARACT, WITH IOL INSERTION
Anesthesia: Monitor Anesthesia Care | Site: Eye | Laterality: Right

## 2018-09-24 MED ORDER — LIDOCAINE HCL (PF) 2 % IJ SOLN
INTRAOCULAR | Status: DC | PRN
  Administered 2018-09-24: 1 mL via INTRAOCULAR

## 2018-09-24 MED ORDER — SODIUM HYALURONATE 10 MG/ML IO SOLN
INTRAOCULAR | Status: DC | PRN
  Administered 2018-09-24: 0.55 mL via INTRAOCULAR

## 2018-09-24 MED ORDER — SODIUM HYALURONATE 23 MG/ML IO SOLN
INTRAOCULAR | Status: DC | PRN
  Administered 2018-09-24: 0.6 mL via INTRAOCULAR

## 2018-09-24 MED ORDER — ARMC OPHTHALMIC DILATING DROPS
1.0000 "application " | OPHTHALMIC | Status: DC | PRN
  Administered 2018-09-24 (×3): 1 via OPHTHALMIC

## 2018-09-24 MED ORDER — MIDAZOLAM HCL 2 MG/2ML IJ SOLN
INTRAMUSCULAR | Status: DC | PRN
  Administered 2018-09-24: 2 mg via INTRAVENOUS

## 2018-09-24 MED ORDER — MOXIFLOXACIN HCL 0.5 % OP SOLN
OPHTHALMIC | Status: DC | PRN
  Administered 2018-09-24: 0.2 mL via OPHTHALMIC

## 2018-09-24 MED ORDER — EPINEPHRINE PF 1 MG/ML IJ SOLN
INTRAOCULAR | Status: DC | PRN
  Administered 2018-09-24: 59 mL via OPHTHALMIC

## 2018-09-24 MED ORDER — TETRACAINE HCL 0.5 % OP SOLN
1.0000 [drp] | OPHTHALMIC | Status: DC | PRN
  Administered 2018-09-24 (×3): 1 [drp] via OPHTHALMIC

## 2018-09-24 MED ORDER — ONDANSETRON HCL 4 MG/2ML IJ SOLN: 4.0000 mg | Freq: Once | INTRAMUSCULAR | Status: DC | PRN

## 2018-09-24 MED ORDER — FENTANYL CITRATE (PF) 100 MCG/2ML IJ SOLN
INTRAMUSCULAR | Status: DC | PRN
  Administered 2018-09-24: 50 ug via INTRAVENOUS

## 2018-09-24 SURGICAL SUPPLY — 19 items
CANNULA ANT/CHMB 27G (MISCELLANEOUS) ×2 IMPLANT
CANNULA ANT/CHMB 27GA (MISCELLANEOUS) ×6 IMPLANT
DISSECTOR HYDRO NUCLEUS 50X22 (MISCELLANEOUS) ×3 IMPLANT
GLOVE SURG LX 7.5 STRW (GLOVE) ×4
GLOVE SURG LX STRL 7.5 STRW (GLOVE) ×1 IMPLANT
GLOVE SURG SYN 8.5  E (GLOVE) ×2
GLOVE SURG SYN 8.5 E (GLOVE) ×1 IMPLANT
GLOVE SURG SYN 8.5 PF PI (GLOVE) ×1 IMPLANT
GOWN STRL REUS W/ TWL LRG LVL3 (GOWN DISPOSABLE) ×2 IMPLANT
GOWN STRL REUS W/TWL LRG LVL3 (GOWN DISPOSABLE) ×6
LENS IOL TECNIS ITEC 25.5 (Intraocular Lens) ×2 IMPLANT
MARKER SKIN DUAL TIP RULER LAB (MISCELLANEOUS) ×3 IMPLANT
PACK DR. KING ARMS (PACKS) ×3 IMPLANT
PACK EYE AFTER SURG (MISCELLANEOUS) ×3 IMPLANT
PACK OPTHALMIC (MISCELLANEOUS) ×3 IMPLANT
SYR 3ML LL SCALE MARK (SYRINGE) ×3 IMPLANT
SYR TB 1ML LUER SLIP (SYRINGE) ×3 IMPLANT
WATER STERILE IRR 250ML POUR (IV SOLUTION) ×3 IMPLANT
WIPE NON LINTING 3.25X3.25 (MISCELLANEOUS) ×3 IMPLANT

## 2018-09-24 NOTE — Anesthesia Preprocedure Evaluation (Signed)
Anesthesia Evaluation  Patient identified by MRN, date of birth, ID band Patient awake    Reviewed: Allergy & Precautions, NPO status , Patient's Chart, lab work & pertinent test results  Airway Mallampati: II  TM Distance: >3 FB     Dental   Pulmonary asthma , sleep apnea ,    breath sounds clear to auscultation       Cardiovascular hypertension,  Rhythm:Regular Rate:Normal  HLD   Neuro/Psych    GI/Hepatic GERD  ,  Endo/Other  diabetesMorbid obesity (BMI 43)  Renal/GU      Musculoskeletal   Abdominal   Peds  Hematology   Anesthesia Other Findings   Reproductive/Obstetrics                             Anesthesia Physical  Anesthesia Plan  ASA: III  Anesthesia Plan: MAC   Post-op Pain Management:    Induction: Intravenous  PONV Risk Score and Plan:   Airway Management Planned: Nasal Cannula and Natural Airway  Additional Equipment:   Intra-op Plan:   Post-operative Plan:   Informed Consent: I have reviewed the patients History and Physical, chart, labs and discussed the procedure including the risks, benefits and alternatives for the proposed anesthesia with the patient or authorized representative who has indicated his/her understanding and acceptance.       Plan Discussed with: CRNA  Anesthesia Plan Comments:         Anesthesia Quick Evaluation

## 2018-09-24 NOTE — Op Note (Signed)
Miranda Pollard [id number] 09/24/2018   PREOPERATIVE DIAGNOSIS:  Nuclear sclerotic cataract right eye.  H25.11   POSTOPERATIVE DIAGNOSIS:    Nuclear sclerotic cataract right eye.     PROCEDURE:  Phacoemusification with posterior chamber intraocular lens placement of the right eye   LENS:   Implant Name Type Inv. Item Serial No. Manufacturer Lot No. LRB No. Used Action  LENS IOL DIOP 25.5 - [id number] Intraocular Lens LENS IOL DIOP 25.5 [id number] AMO  Right 1 Implanted       PCB00 +25.5   ULTRASOUND TIME: 0 minutes 14 seconds.  CDE 1.32   SURGEON:  Benay Pillow, MD, MPH  ANESTHESIOLOGIST: Anesthesiologist: Veda Canning, MD CRNA: Cameron Ali, CRNA   ANESTHESIA:  Topical with tetracaine drops augmented with 1% preservative-free intracameral lidocaine.  ESTIMATED BLOOD LOSS: less than 1 mL.   COMPLICATIONS:  None.   DESCRIPTION OF PROCEDURE:  The patient was identified in the holding room and transported to the operating room and placed in the supine position under the operating microscope.  The right eye was identified as the Miranda eye and it was prepped and draped in the usual sterile ophthalmic fashion.   A 1.0 millimeter clear-corneal paracentesis was made at the 10:30 position. 0.5 ml of preservative-free 1% lidocaine with epinephrine was injected into the anterior chamber.  The anterior chamber was filled with Healon 5 viscoelastic.  A 2.4 millimeter keratome was used to make a near-clear corneal incision at the 8:00 position.  A curvilinear capsulorrhexis was made with a cystotome and capsulorrhexis forceps.  Balanced salt solution was used to hydrodissect and hydrodelineate the nucleus.   Phacoemulsification was then used in stop and chop fashion to remove the lens nucleus and epinucleus.  The remaining cortex was then removed using the irrigation and aspiration handpiece. Healon was then placed into the capsular bag to distend it for lens placement.   A lens was then injected into the capsular bag.  The remaining viscoelastic was aspirated.   Wounds were hydrated with balanced salt solution.  The anterior chamber was inflated to a physiologic pressure with balanced salt solution.   Intracameral vigamox 0.1 mL undiluted was injected into the eye and a drop placed onto the ocular surface.  No wound leaks were noted.  The patient was taken to the recovery room in stable condition without complications of anesthesia or surgery  Benay Pillow 09/24/2018, 7:57 AM

## 2018-09-24 NOTE — Anesthesia Procedure Notes (Signed)
Procedure Name: MAC Date/Time: 09/24/2018 7:41 AM Performed by: Cameron Ali, CRNA Pre-anesthesia Checklist: Patient identified, Emergency Drugs available, Suction available, Timeout performed and Patient being monitored Patient Re-evaluated:Patient Re-evaluated prior to induction Oxygen Delivery Method: Nasal cannula Placement Confirmation: positive ETCO2

## 2018-09-24 NOTE — Anesthesia Postprocedure Evaluation (Signed)
Anesthesia Post Note  Patient: Miranda Pollard  Procedure(s) Performed: CATARACT EXTRACTION PHACO AND INTRAOCULAR LENS PLACEMENT (IOC)  RIGHT DIABETIC (Right Eye)  Patient location during evaluation: PACU Anesthesia Type: MAC Level of consciousness: awake and alert Pain management: pain level controlled Vital Signs Assessment: post-procedure vital signs reviewed and stable Respiratory status: spontaneous breathing, nonlabored ventilation, respiratory function stable and patient connected to nasal cannula oxygen Cardiovascular status: stable and blood pressure returned to baseline Postop Assessment: no apparent nausea or vomiting Anesthetic complications: no    Veda Canning

## 2018-09-24 NOTE — Transfer of Care (Signed)
Immediate Anesthesia Transfer of Care Note  Patient: [name]  Procedure(s) Performed: CATARACT EXTRACTION PHACO AND INTRAOCULAR LENS PLACEMENT (IOC)  RIGHT DIABETIC (Right Eye)  Patient Location: PACU  Anesthesia Type: MAC  Level of Consciousness: awake, alert  and patient cooperative  Airway and Oxygen Therapy: Patient Spontanous Breathing and Patient connected to supplemental oxygen  Post-op Assessment: Post-op Vital signs reviewed, Patient's Cardiovascular Status Stable, Respiratory Function Stable, Patent Airway and No signs of Nausea or vomiting  Post-op Vital Signs: Reviewed and stable  Complications: No apparent anesthesia complications

## 2018-09-25 ENCOUNTER — Encounter: Payer: Self-pay | Admitting: Ophthalmology

## 2018-09-25 NOTE — H&P (Signed)
The history and physical update is also available via paper chart and have been scanned.  Due to computer logon failure for the first case.   The History and Physical notes are on paper, have been signed, and are to be scanned.   I have examined the patient and there are no changes to the H&P.   Attestation: 1. The patient's impairment of visual function is believed not to be correctable with a tolerable change in glasses or contact lenses. 2. Cataract (in the operative eye) is believed to be significantly contributing to the patient's visual impairment. 3. The patient desires surgical correction; the risks, benefits and alternatives have been explained; and a reasonable expectation exists that lens surgery will significantly improve both the visual and functional status of the patient.  I certify the statements are true to the best of my knowledge.  Miranda Pollard 09/25/2018 9:44 AM

## 2018-12-20 IMAGING — CR DG CHEST 2V
2 series · 2 of 2 positions shown · non-contrast
Comparison: 05/05/2017

CLINICAL DATA: Chest pain that started this morning

EXAM:
CHEST - 2 VIEW

[chest pa]
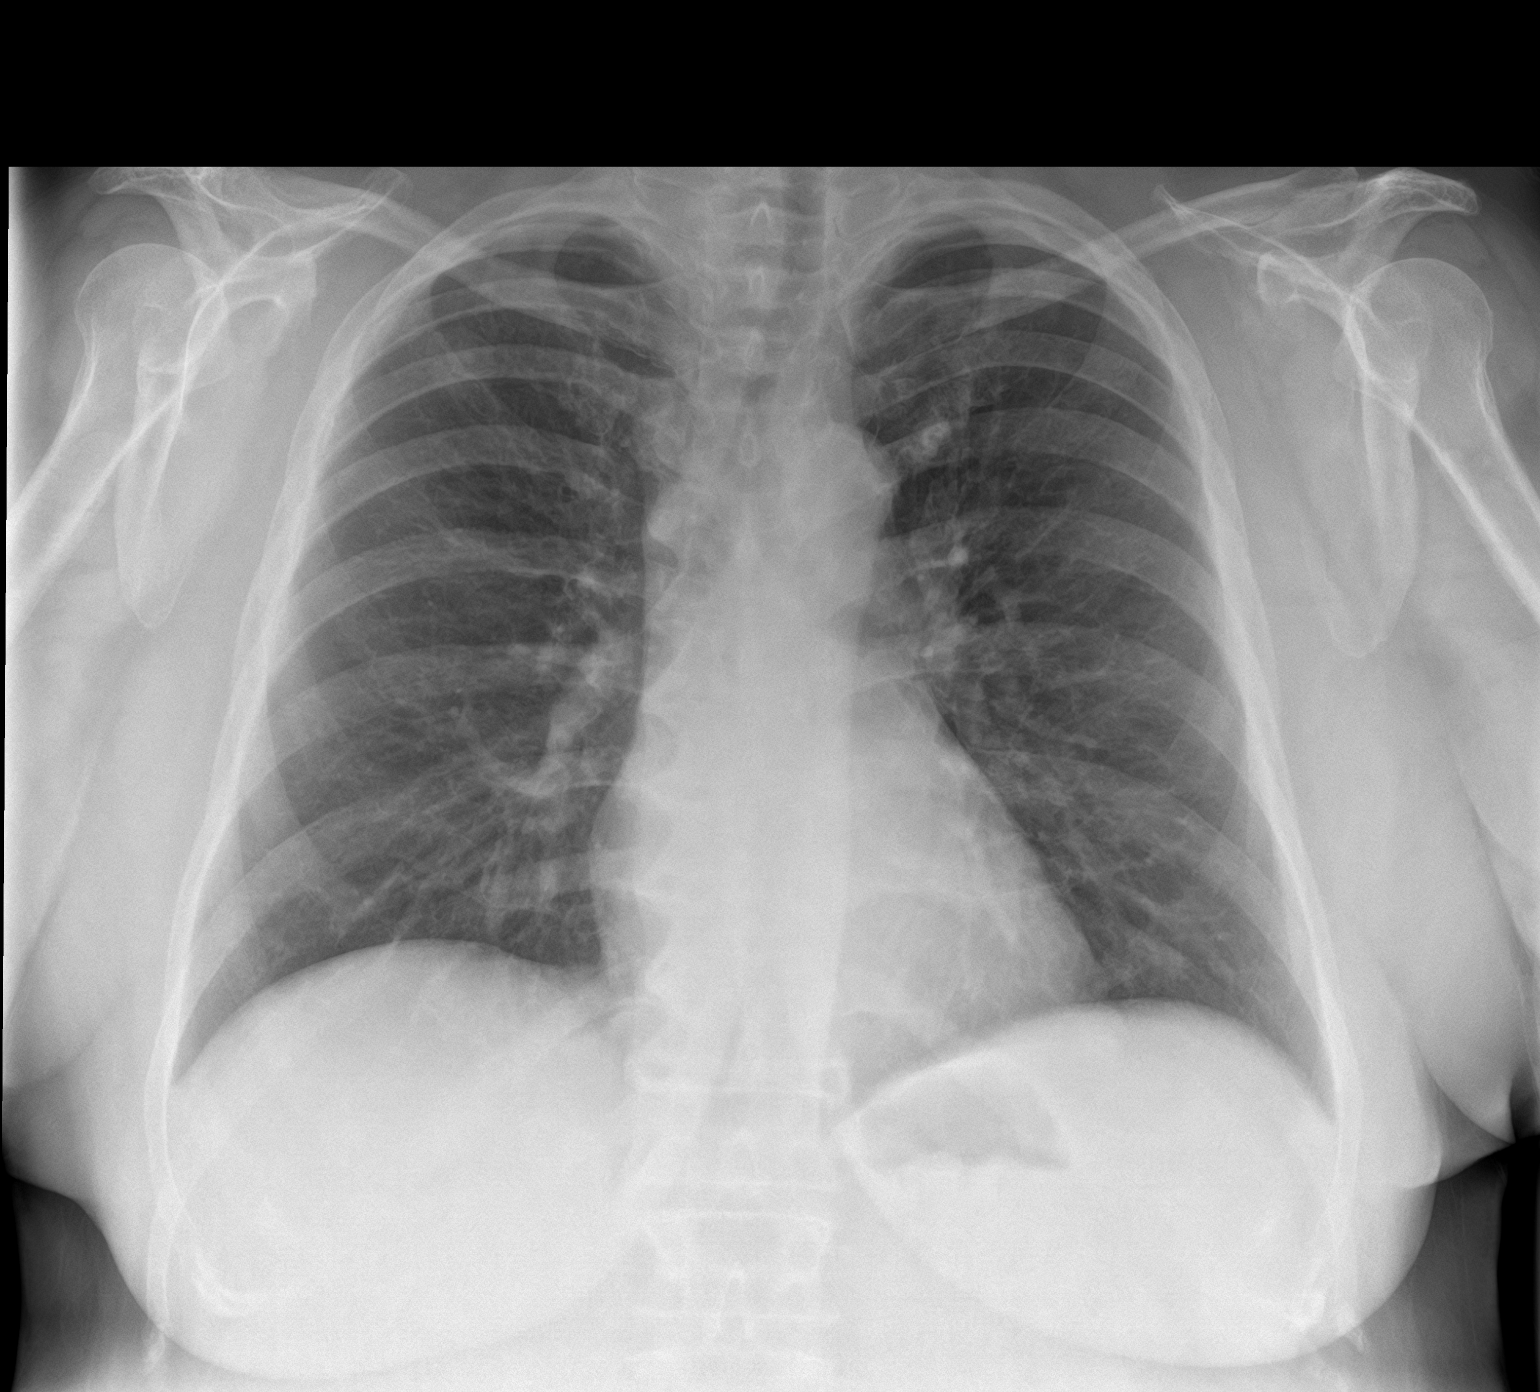

[chest lat]
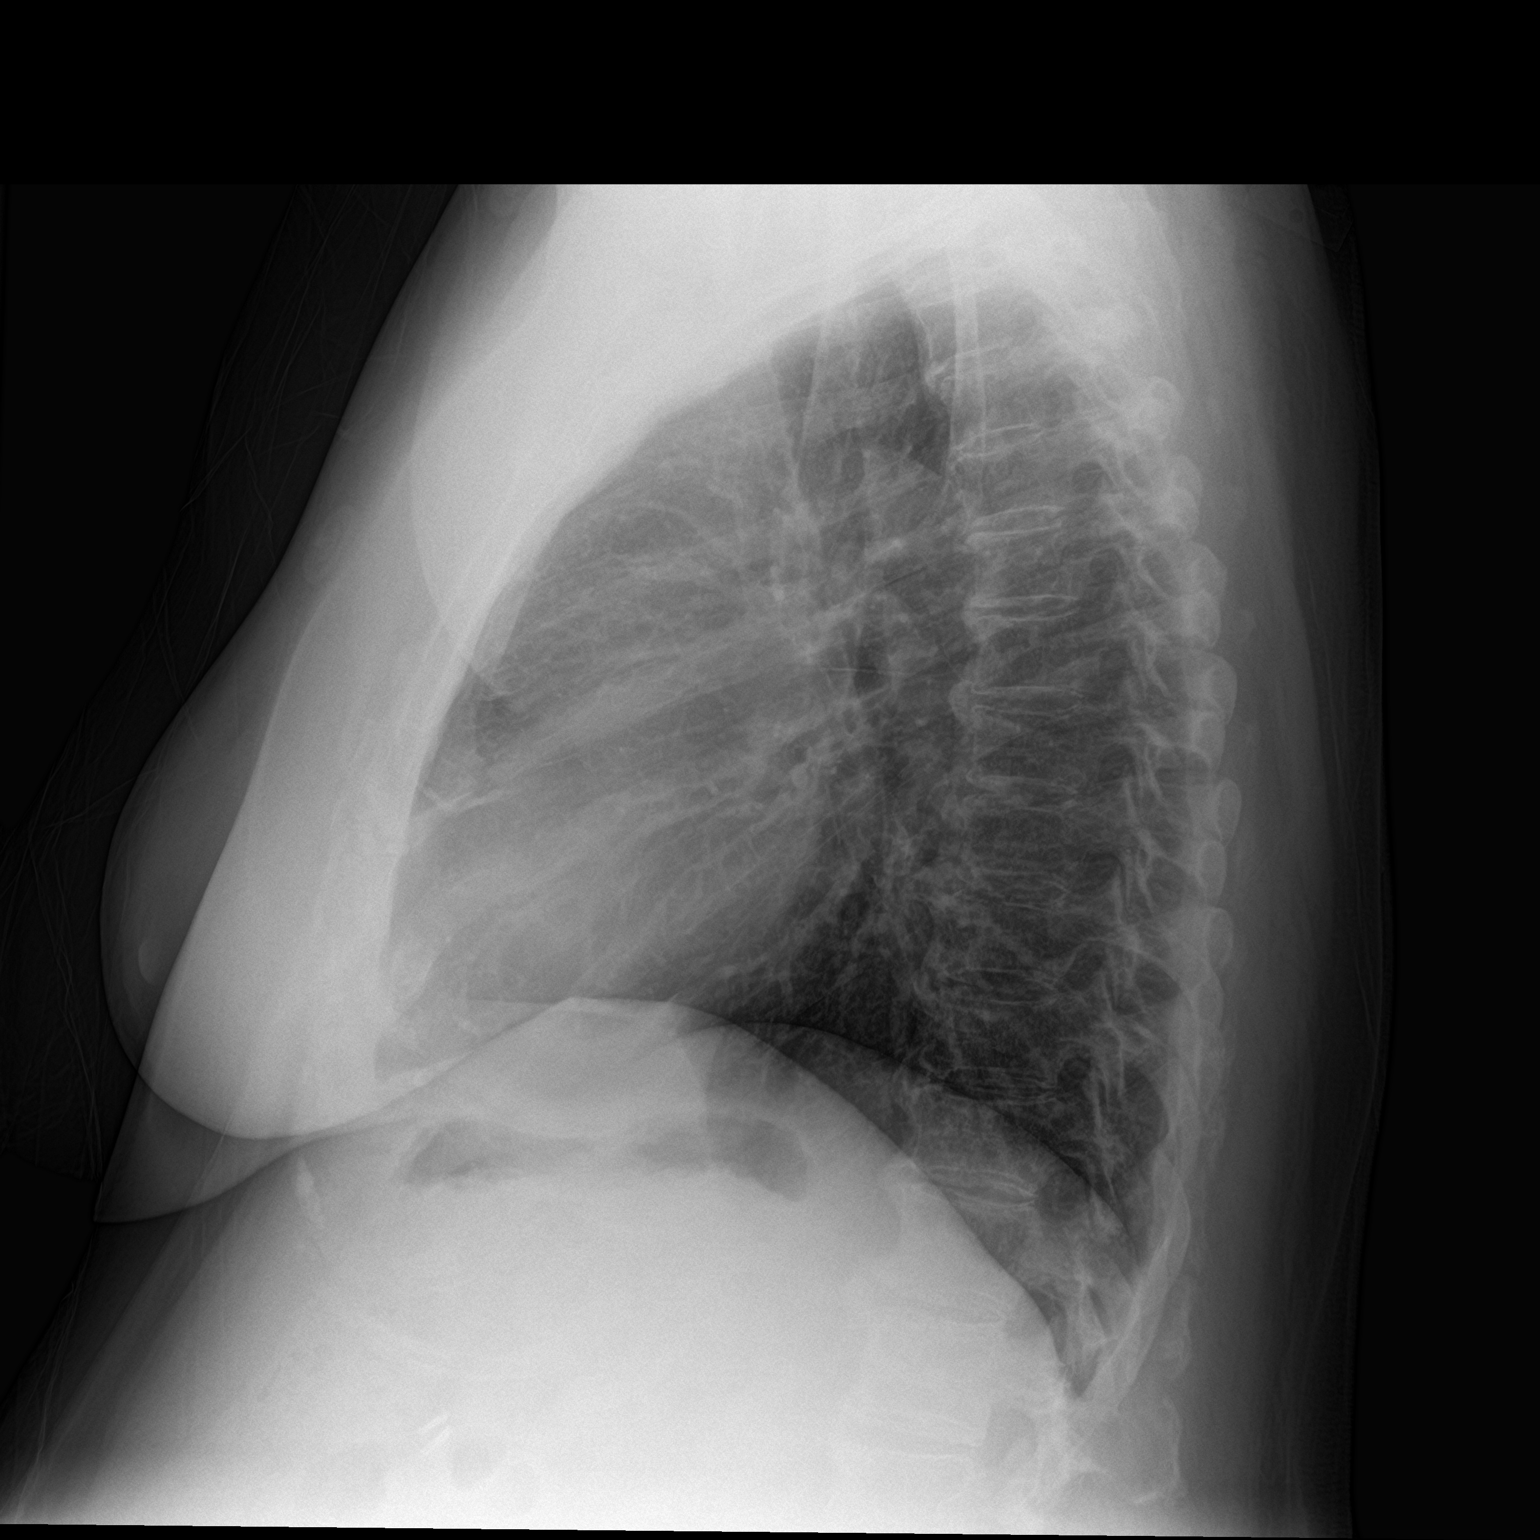

[2 of 2 positions shown; findings below may reference images not displayed]

FINDINGS: Artifact from EKG leads, including over the right upper chest. There
is no edema, consolidation, effusion, or pneumothorax. Normal heart
size and aortic contour. Deviation of the trachea towards the left,
chronic when compared to 8115. Degenerative endplate spurring
IMPRESSION: 1. No evidence of acute disease.
2. Chronic leftward tracheal deviation, please correlate with
thyroid exam.

## 2019-07-19 ENCOUNTER — Other Ambulatory Visit: Payer: Self-pay | Admitting: Family Medicine

## 2019-07-19 DIAGNOSIS — Z1231 Encounter for screening mammogram for malignant neoplasm of breast: Secondary | ICD-10-CM

## 2019-07-23 ENCOUNTER — Ambulatory Visit: Payer: Medicare Other

## 2019-07-26 ENCOUNTER — Other Ambulatory Visit: Payer: Self-pay

## 2019-07-26 ENCOUNTER — Ambulatory Visit
Admission: EM | Admit: 2019-07-26 | Discharge: 2019-07-26 | Disposition: A | Discharge status: Home / Self Care | Payer: Medicare Other | Attending: Family Medicine | Admitting: Family Medicine

## 2019-07-26 ENCOUNTER — Encounter: Payer: Self-pay | Admitting: Emergency Medicine

## 2019-07-26 DIAGNOSIS — I8001 Phlebitis and thrombophlebitis of superficial vessels of right lower extremity: Secondary | ICD-10-CM

## 2019-07-26 NOTE — ED Provider Notes (Signed)
[facility]    CSN: [id number] Arrival date & time: 07/26/19  1411      History   Chief Complaint Chief Complaint  Patient presents with  . Leg Pain    right    HPI Miranda Pollard is a 69 y.o. female.   69 yo female with a c/o "a knot" on the side of the right lower leg. States the skin hurts in the area. Denies any trauma, injuries, insect bites, fevers, chills, drainage, bleeding.    Leg Pain   Past Medical History:  Diagnosis Date  . Apnea   . Arthritis    hands  . Asthma   . Calculus of kidney   . Diabetes mellitus without complication ([place])   . Family history of breast cancer 03/04/2015   MyRisk NEG  . Genital herpes   . GERD (gastroesophageal reflux disease)   . History of Papanicolaou smear of cervix 05/02/2016   NIL/NEG  . Hypertension   . Hypertension   . Macular degeneration (senile) of retina   . Motion sickness   . Sleep apnea    CPAP    Patient Active Problem List   Diagnosis Date Noted  . Morbid obesity ([place]) 01/02/2018  . Hyperlipidemia 01/02/2018  . Type 2 diabetes mellitus with complication, without long-term current use of insulin ([place]) 01/02/2018  . Chest pain 12/31/2017  . Adjustment disorder 12/31/2017  . Family history of breast cancer 09/07/2016    Past Surgical History:  Procedure Laterality Date  . CARDIAC CATHETERIZATION  05/2007   No stents [facility]  . CARPAL TUNNEL RELEASE  1980's  . CATARACT EXTRACTION W/PHACO Left 08/27/2018   Procedure: CATARACT EXTRACTION PHACO AND INTRAOCULAR LENS PLACEMENT ([place])  LEFT DIABETIC;  Surgeon: Eulogio Bear, MD;  Location: [place];  Service: Ophthalmology;  Laterality: Left;  Diabetic - oral meds sleep apnea  . CATARACT EXTRACTION W/PHACO Right 09/24/2018   Procedure: CATARACT EXTRACTION PHACO AND INTRAOCULAR LENS PLACEMENT ([place])  RIGHT DIABETIC;  Surgeon: Eulogio Bear, MD;  Location: [place];  Service: Ophthalmology;  Laterality: Right;   diabetic - insulin and oral meds  . CHOLECYSTECTOMY, LAPAROSCOPIC  2002  . COLONOSCOPY  07/2014  . ESOPHAGOGASTRODUODENOSCOPY ENDOSCOPY  07/2014  . HYSTEROSCOPY  2004   cystic hyperplasia  . HYSTEROSCOPY  2008   postemopausa bleeding weakly proliferative endometrium on pathology  . LAPAROSCOPY  1993   Laparoscopy and laparotomy for "ovarian cysts and fibroid"    OB History    Gravida  1   Para      Term      Preterm      AB  1   Living        SAB  1   TAB      Ectopic      Multiple      Live Births  0            Home Medications    Prior to Admission medications   Medication Sig Start Date End Date Taking? Authorizing Provider  aspirin EC 81 MG tablet Take 81 mg by mouth daily.   Yes [provider]  atorvastatin (LIPITOR) 10 MG tablet Take 10 mg by mouth daily.   Yes [provider]  Cyanocobalamin (VITAMIN B 12 PO) Take by mouth.   Yes [provider]  fluticasone (FLONASE) 50 MCG/ACT nasal spray Place 2 sprays into both nostrils daily. 05/05/17  Yes Melynda Ripple, MD  glimepiride (AMARYL) 1  MG tablet Take 1 mg by mouth 2 (two) times daily.   Yes [provider]  hydrOXYzine (ATARAX/VISTARIL) 25 MG tablet Take 25 mg by mouth 2 (two) times daily as needed.   Yes [provider]  insulin degludec (TRESIBA FLEXTOUCH) 100 UNIT/ML FlexTouch Pen INJECT 20 UNITS UNDER THE SKIN ONCE A DAY. TITRATE TO A MAX DOSE OF 50 UNITS DAILY 11/30/18  Yes [provider]  lisinopril (PRINIVIL,ZESTRIL) 20 MG tablet Take 20 mg by mouth daily.   Yes [provider]  metFORMIN (GLUMETZA) 1000 MG (MOD) 24 hr tablet Take 1,000 mg by mouth 2 (two) times daily with a meal.    Yes [provider]  omeprazole (PRILOSEC) 20 MG capsule Take 20 mg by mouth daily.   Yes [provider]  pioglitazone (ACTOS) 15 MG tablet Take 15 mg by mouth daily.   Yes [provider]  sertraline (ZOLOFT) 50 MG tablet  Take 50 mg by mouth daily.   Yes [provider]  acetaminophen (TYLENOL) 650 MG CR tablet Take by mouth as needed.     [provider]  albuterol (PROVENTIL HFA;VENTOLIN HFA) 108 (90 Base) MCG/ACT inhaler Inhale 2 puffs into the lungs every 4 (four) hours as needed for wheezing or shortness of breath. 05/05/17   Domenick Gong, MD  bevacizumab (AVASTIN) 1.25 mg/0.1 mL SOLN Every 13-14 wks right eye.    [provider]  cetirizine (ZYRTEC) 10 MG tablet Take 10 mg by mouth daily.    [provider]  gabapentin (NEURONTIN) 100 MG capsule Take 100 mg by mouth at bedtime.    [provider]  INSULIN DEGLUDEC [place] Inject 20 Units into the skin daily.    [provider]  loperamide (IMODIUM A-D) 2 MG tablet Take 2 mg by mouth as needed for diarrhea or loose stools.    [provider]  meloxicam (MOBIC) 15 MG tablet Take 15 mg by mouth daily.    [provider]    Family History Family History  Problem Relation Age of Onset  . Breast cancer Sister 84  . Breast cancer Cousin 60       2 pat cousins  . Hypertension Mother   . Melanoma Mother 43  . Heart Problems Mother   . Asthma Father   . Congestive Heart Failure Father   . COPD Father   . Diabetes Father   . Emphysema Father   . Hypertension Brother   . Sleep apnea Brother   . Colon cancer Maternal Grandmother   . Heart attack Maternal Uncle   . Heart attack Maternal Grandfather     Social History Social History   Tobacco Use  . Smoking status: Never Smoker  . Smokeless tobacco: Never Used  Substance Use Topics  . Alcohol use: No  . Drug use: No     Allergies   Blue dyes (parenteral), Ciprofloxacin, and Hydrocodone   Review of Systems Review of Systems   Physical Exam Triage Vital Signs ED Triage Vitals  Enc Vitals Group     BP 07/26/19 1426 (!) 157/69     Pulse Rate 07/26/19 1426 92     Resp 07/26/19 1426 16     Temp 07/26/19 1426 98.4 F  (36.9 C)     Temp Source 07/26/19 1426 Oral     SpO2 07/26/19 1426 98 %     Weight 07/26/19 1423 258 lb (117 kg)     Height 07/26/19 1423 5\' 5"  (1.651  m)     Head Circumference --      Peak Flow --      Pain Score 07/26/19 1423 3     Pain Loc --      Pain Edu? --      Excl. in GC? --    No data found.  Updated Vital Signs BP (!) 157/69 (BP Location: Left Arm)   Pulse 92   Temp 98.4 F (36.9 C) (Oral)   Resp 16   Ht 5\' 5"  (1.651 m)   Wt 117 kg   SpO2 98%   BMI 42.93 kg/m   Visual Acuity Right Eye Distance:   Left Eye Distance:   Bilateral Distance:    Right Eye Near:   Left Eye Near:    Bilateral Near:     Physical Exam Vitals and nursing note reviewed.  Constitutional:      General: She is not in acute distress.    Appearance: She is not toxic-appearing or diaphoretic.  Musculoskeletal:       Legs:     Comments: Superficial Right lateral lower leg skin (just above the ankle) with an area 1cm slightly raised and tender varicosity; no bleeding or drainage  Neurological:     Mental Status: She is alert.      UC Treatments / Results  Labs (all labs ordered are listed, but only abnormal results are displayed) Labs Reviewed - No data to display  EKG   Radiology No results found.  Procedures Procedures (including critical care time)  Medications Ordered in UC Medications - No data to display  Initial Impression / Assessment and Plan / UC Course  I have reviewed the triage vital signs and the nursing notes.  Pertinent labs & imaging results that were available during my care of the patient were reviewed by me and considered in my medical decision making (see chart for details).      Final Clinical Impressions(s) / UC Diagnoses   Final diagnoses:  Superficial phlebitis of leg, right    ED Prescriptions    None      1. diagnosis reviewed with patient 2. Recommend supportive treatment with heat, over the counter anti-inflammatories 3.  Follow-up prn   PDMP not reviewed this encounter.   , MD 07/26/19 [phone]

## 2019-07-26 NOTE — ED Triage Notes (Signed)
Patient c/o knot on her right lower leg that she noticed this morning.  Patient reports tenderness at the site and in her right foot.

## 2019-07-30 ENCOUNTER — Ambulatory Visit
Admission: RE | Admit: 2019-07-30 | Discharge: 2019-07-30 | Disposition: A | Discharge status: Home / Self Care | Payer: Medicare Other | Source: Ambulatory Visit | Attending: Family Medicine | Admitting: Family Medicine

## 2019-07-30 ENCOUNTER — Other Ambulatory Visit: Payer: Self-pay

## 2019-07-30 DIAGNOSIS — Z1231 Encounter for screening mammogram for malignant neoplasm of breast: Secondary | ICD-10-CM | POA: Insufficient documentation

## 2020-02-14 LAB — HM DEXA SCAN

## 2020-09-17 IMAGING — MG DIGITAL SCREENING BILAT W/ TOMO W/ CAD
6 of 12 series · 6 of 36 positions shown · non-contrast
Comparison: Previous exam(s).

ACR Breast Density Category a: The breast tissue is almost entirely
fatty.

CLINICAL DATA: Screening.

EXAM:
DIGITAL SCREENING BILATERAL MAMMOGRAM WITH TOMO AND CAD

[R MLO synth-2D (1 of 2)]
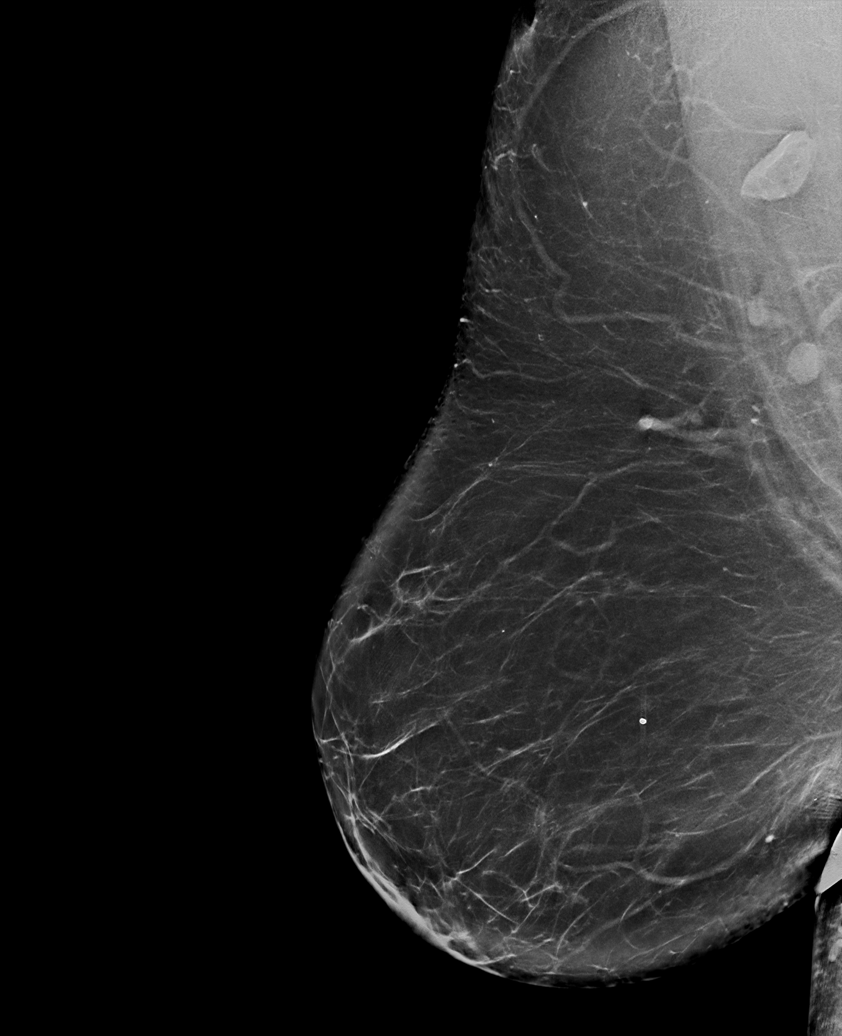

[L CC synth-2D]
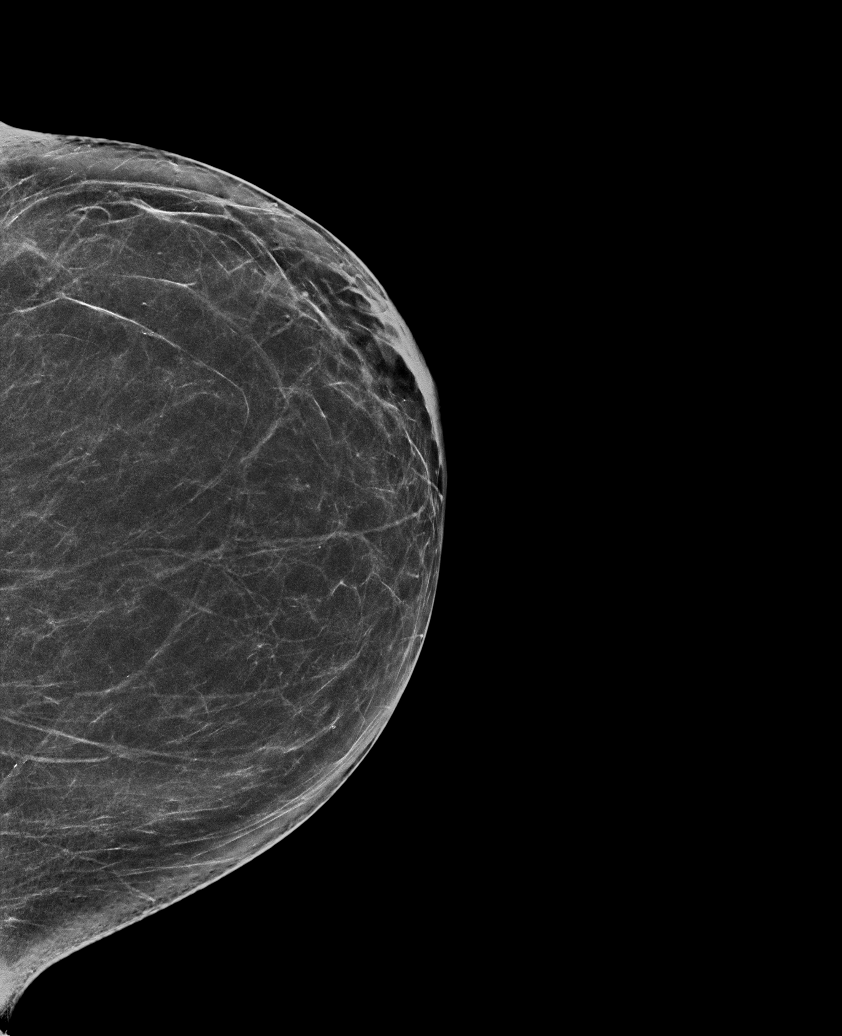

[L MLO synth-2D (1 of 2)]
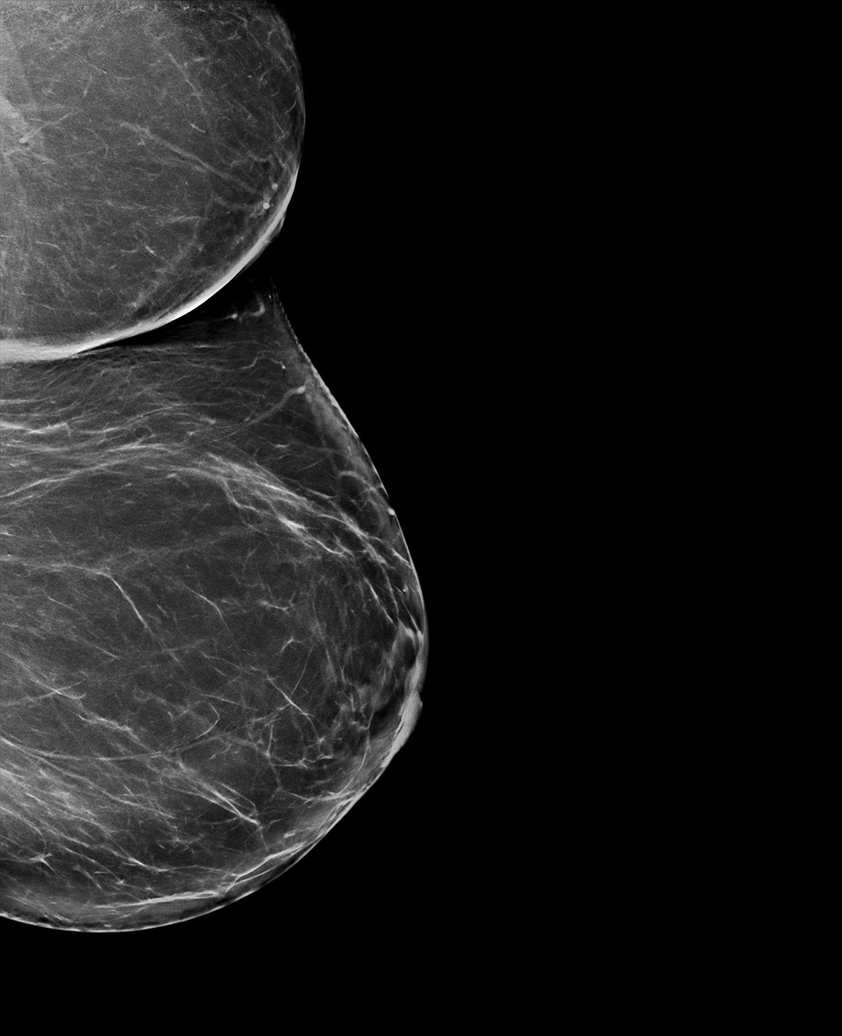

[R MLO synth-2D (2 of 2)]
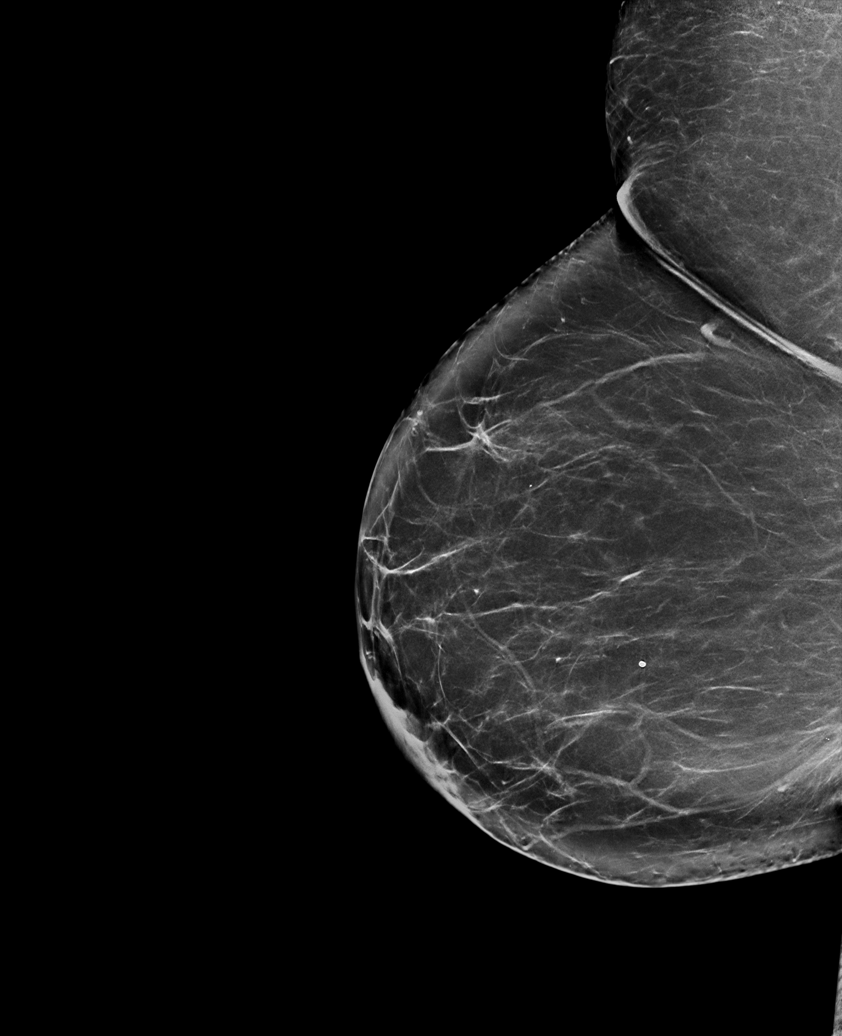

[R CC synth-2D]
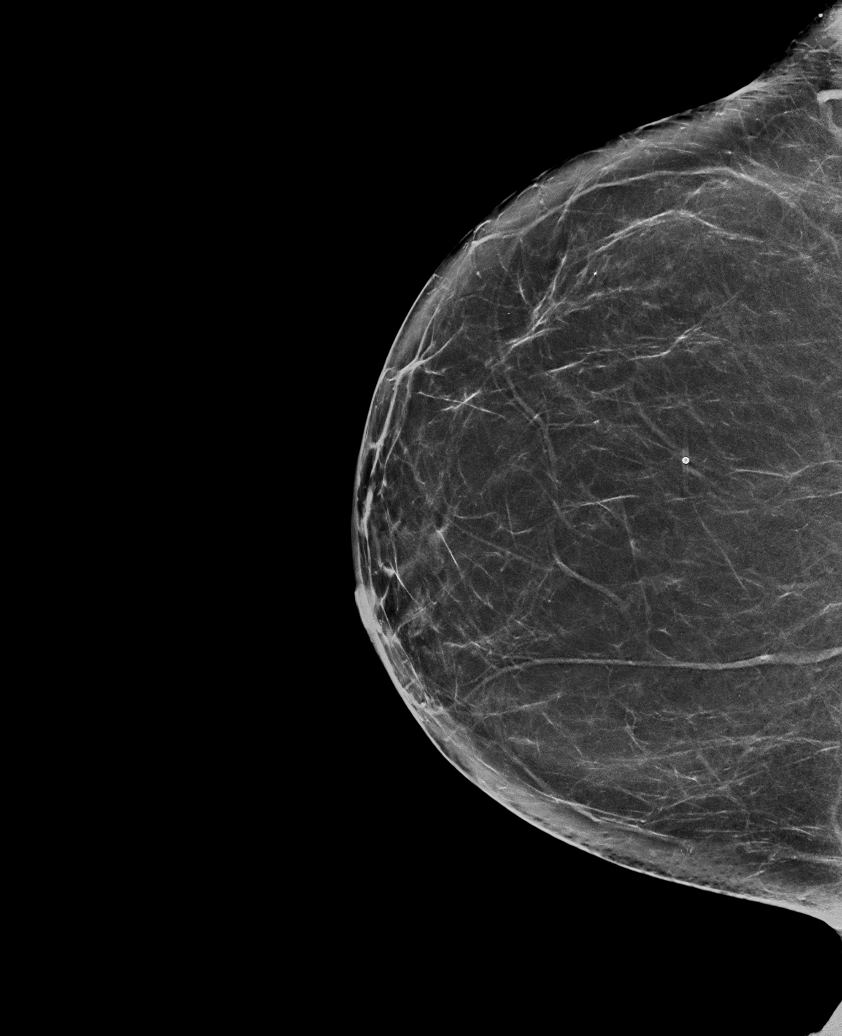

[L MLO synth-2D (2 of 2)]
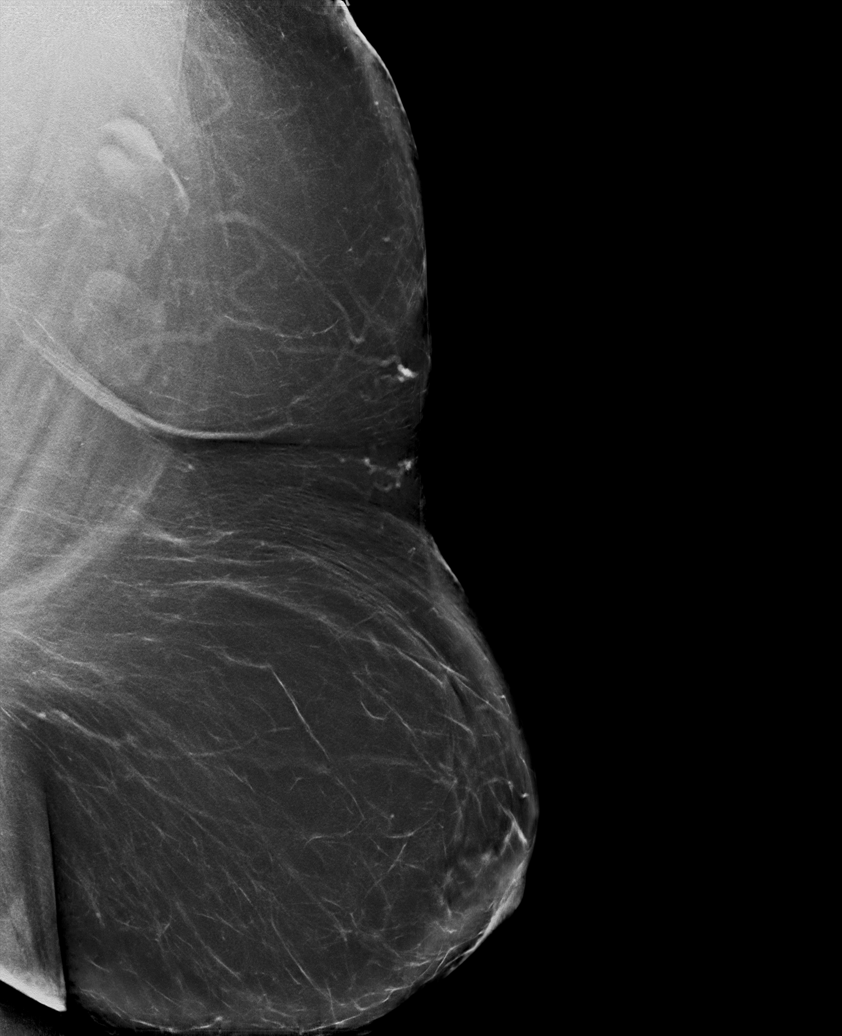

[6 of 36 positions shown; findings below may reference images not displayed]

FINDINGS: There are no findings suspicious for malignancy. Images were
processed with CAD.
IMPRESSION: No mammographic evidence of malignancy. A result letter of this
screening mammogram will be mailed directly to the patient.

RECOMMENDATION:
Screening mammogram in one year. (Code:8Y-[id number])

BI-RADS CATEGORY  1: Negative.
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
## 2021-09-15 ENCOUNTER — Encounter: Payer: Self-pay | Admitting: Cardiovascular Disease

## 2021-09-15 ENCOUNTER — Ambulatory Visit: Payer: Medicare Other | Admitting: Cardiovascular Disease

## 2021-09-15 VITALS — BP 122/78 | HR 82 | Ht 65.0 in | Wt 235.0 lb

## 2021-09-15 DIAGNOSIS — E118 Type 2 diabetes mellitus with unspecified complications: Secondary | ICD-10-CM | POA: Diagnosis not present

## 2021-09-15 DIAGNOSIS — E782 Mixed hyperlipidemia: Secondary | ICD-10-CM

## 2021-09-15 DIAGNOSIS — R079 Chest pain, unspecified: Secondary | ICD-10-CM | POA: Diagnosis not present

## 2021-09-15 NOTE — Patient Instructions (Addendum)
Medication Instructions:  No changes  If you need a refill on your cardiac medications before your next appointment, please call your pharmacy.   Lab work: No new labs needed  Testing/Procedures: No new testing needed  Follow-Up: At [facility], you and your health needs are our priority.  As part of our continuing mission to provide you with exceptional heart care, we have created designated Provider Care Teams.  These Care Teams include your primary Cardiologist (physician) and Advanced Practice Providers (APPs -  Physician Assistants and Nurse Practitioners) who all work together to provide you with the care you need, when you need it.  You will need a follow up appointment as needed  Providers on your designated Care Team:   Christopher Berge, NP Ryan Dunn, PA-C Cadence Furth, PA-C  COVID-19 Vaccine Information can be found at: [email or web address] For questions related to vaccine distribution or appointments, please email [email or web address] or call [phone].    

## 2022-01-20 ENCOUNTER — Emergency Department: Payer: No Typology Code available for payment source

## 2022-01-20 ENCOUNTER — Other Ambulatory Visit: Payer: Self-pay

## 2022-01-20 ENCOUNTER — Emergency Department
Admission: EM | Admit: 2022-01-20 | Discharge: 2022-01-21 | Disposition: A | Discharge status: Home / Self Care | Payer: No Typology Code available for payment source | Attending: Emergency Medicine | Admitting: Emergency Medicine

## 2022-01-20 DIAGNOSIS — R0789 Other chest pain: Secondary | ICD-10-CM | POA: Insufficient documentation

## 2022-01-20 DIAGNOSIS — J45909 Unspecified asthma, uncomplicated: Secondary | ICD-10-CM | POA: Diagnosis not present

## 2022-01-20 DIAGNOSIS — M79675 Pain in left toe(s): Secondary | ICD-10-CM | POA: Insufficient documentation

## 2022-01-20 DIAGNOSIS — R739 Hyperglycemia, unspecified: Secondary | ICD-10-CM

## 2022-01-20 DIAGNOSIS — S51811A Laceration without foreign body of right forearm, initial encounter: Secondary | ICD-10-CM | POA: Diagnosis not present

## 2022-01-20 DIAGNOSIS — Z1152 Encounter for screening for COVID-19: Secondary | ICD-10-CM | POA: Diagnosis not present

## 2022-01-20 DIAGNOSIS — S0990XA Unspecified injury of head, initial encounter: Secondary | ICD-10-CM | POA: Insufficient documentation

## 2022-01-20 DIAGNOSIS — Y92410 Unspecified street and highway as the place of occurrence of the external cause: Secondary | ICD-10-CM | POA: Insufficient documentation

## 2022-01-20 DIAGNOSIS — E1165 Type 2 diabetes mellitus with hyperglycemia: Secondary | ICD-10-CM | POA: Insufficient documentation

## 2022-01-20 DIAGNOSIS — Z23 Encounter for immunization: Secondary | ICD-10-CM | POA: Insufficient documentation

## 2022-01-20 DIAGNOSIS — N200 Calculus of kidney: Secondary | ICD-10-CM | POA: Diagnosis not present

## 2022-01-20 DIAGNOSIS — K760 Fatty (change of) liver, not elsewhere classified: Secondary | ICD-10-CM | POA: Insufficient documentation

## 2022-01-20 DIAGNOSIS — I10 Essential (primary) hypertension: Secondary | ICD-10-CM | POA: Diagnosis not present

## 2022-01-20 DIAGNOSIS — I700 Atherosclerosis of aorta: Secondary | ICD-10-CM | POA: Insufficient documentation

## 2022-01-20 LAB — CBC WITH DIFFERENTIAL/PLATELET
Abs Immature Granulocytes: 0.06 10*3/uL (ref 0.00–0.07)
Basophils Absolute: 0.1 10*3/uL (ref 0.0–0.1)
Basophils Relative: 1 %
Eosinophils Absolute: 0.1 10*3/uL (ref 0.0–0.5)
Eosinophils Relative: 1 %
HCT: 43.6 % (ref 36.0–46.0)
Hemoglobin: 14.3 g/dL (ref 12.0–15.0)
Immature Granulocytes: 1 %
Lymphocytes Relative: 25 %
Lymphs Abs: 2.5 10*3/uL (ref 0.7–4.0)
MCH: 28 pg (ref 26.0–34.0)
MCHC: 32.8 g/dL (ref 30.0–36.0)
MCV: 85.3 fL (ref 80.0–100.0)
Monocytes Absolute: 0.7 10*3/uL (ref 0.1–1.0)
Monocytes Relative: 7 %
Neutro Abs: 6.7 10*3/uL (ref 1.7–7.7)
Neutrophils Relative %: 65 %
Platelets: 307 10*3/uL (ref 150–400)
RBC: 5.11 MIL/uL (ref 3.87–5.11)
RDW: 12.8 % (ref 11.5–15.5)
WBC: 10.1 10*3/uL (ref 4.0–10.5)
nRBC: 0 % (ref 0.0–0.2)

## 2022-01-20 LAB — COMPREHENSIVE METABOLIC PANEL
ALT: 30 U/L (ref 0–44)
AST: 37 U/L (ref 15–41)
Albumin: 3.8 g/dL (ref 3.5–5.0)
Alkaline Phosphatase: 74 U/L (ref 38–126)
Anion gap: 11 (ref 5–15)
BUN: 13 mg/dL (ref 8–23)
CO2: 25 mmol/L (ref 22–32)
Calcium: 8.8 mg/dL — ABNORMAL LOW (ref 8.9–10.3)
Chloride: 99 mmol/L (ref 98–111)
Creatinine, Ser: 0.92 mg/dL (ref 0.44–1.00)
GFR, Estimated: >60 mL/min (ref >60)
Glucose, Bld: 530 mg/dL (ref 70–99)
Potassium: 4.3 mmol/L (ref 3.5–5.1)
Sodium: 135 mmol/L (ref 135–145)
Total Bilirubin: 0.5 mg/dL (ref 0.3–1.2)
Total Protein: 7.6 g/dL (ref 6.5–8.1)

## 2022-01-20 LAB — URINALYSIS, ROUTINE W REFLEX MICROSCOPIC
Bacteria, UA: NONE SEEN
Bilirubin Urine: NEGATIVE
Glucose, UA: >=500 mg/dL — AB
Hgb urine dipstick: NEGATIVE
Ketones, ur: NEGATIVE mg/dL
Leukocytes,Ua: NEGATIVE
Nitrite: NEGATIVE
Protein, ur: NEGATIVE mg/dL
Specific Gravity, Urine: 1.023 (ref 1.005–1.030)
pH: 5 (ref 5.0–8.0)

## 2022-01-20 LAB — TROPONIN I (HIGH SENSITIVITY)
Troponin I (High Sensitivity): 6 ng/L (ref <18)
Troponin I (High Sensitivity): 5 ng/L (ref <18)

## 2022-01-20 LAB — CBG MONITORING, ED
Glucose-Capillary: 318 mg/dL — ABNORMAL HIGH (ref 70–99)
Glucose-Capillary: 405 mg/dL — ABNORMAL HIGH (ref 70–99)
Glucose-Capillary: 460 mg/dL — ABNORMAL HIGH (ref 70–99)

## 2022-01-20 LAB — SARS CORONAVIRUS 2 BY RT PCR: SARS Coronavirus 2 by RT PCR: NEGATIVE

## 2022-01-20 MED ORDER — TETANUS-DIPHTH-ACELL PERTUSSIS 5-2.5-18.5 LF-MCG/0.5 IM SUSY
0.5000 mL | PREFILLED_SYRINGE | Freq: Once | INTRAMUSCULAR | Status: AC
  Administered 2022-01-20: 0.5 mL via INTRAMUSCULAR
  Filled 2022-01-20: qty 0.5

## 2022-01-20 MED ORDER — IOHEXOL 300 MG/ML  SOLN
100.0000 mL | Freq: Once | INTRAMUSCULAR | Status: AC | PRN
  Administered 2022-01-20: 100 mL via INTRAVENOUS

## 2022-01-20 MED ORDER — SODIUM CHLORIDE 0.9 % IV BOLUS
1000.0000 mL | Freq: Once | INTRAVENOUS | Status: AC
  Administered 2022-01-20: 1000 mL via INTRAVENOUS

## 2022-01-20 MED ORDER — INSULIN ASPART 100 UNIT/ML IJ SOLN
10.0000 [IU] | Freq: Once | INTRAMUSCULAR | Status: AC
  Administered 2022-01-20: 10 [IU] via INTRAVENOUS
  Filled 2022-01-20: qty 1

## 2022-01-20 NOTE — Discharge Instructions (Signed)
For your pain, take over-the-counter medications as we discussed  For your hyperglycemia (high blood sugar), please monitor your diet and sugars at home. Call and discuss with your primary doctor.  For your fever, I suspect this was due to trauma. If you have ongoing fevers or infectious symptoms, see your primary doctor. Your XRay showed no signs of pneumonia and urine was normal today.
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
## 2022-01-21 NOTE — ED Notes (Signed)
Pt verbalized understanding of discharge instructions and follow-up care instructions.  

## 2022-01-21 NOTE — ED Notes (Signed)
Pt soiled herself. Pt given paper scrubs to change into.

## 2022-01-21 NOTE — ED Notes (Signed)
Wound care done by this RN. Pt's wound cleaned and dressed.

## 2022-01-21 NOTE — ED Notes (Signed)
Pt ambulatory with steady gait 
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
## 2022-01-31 ENCOUNTER — Ambulatory Visit: Payer: Medicare Other | Admitting: Cardiovascular Disease

## 2022-01-31 DIAGNOSIS — I10 Essential (primary) hypertension: Secondary | ICD-10-CM

## 2022-01-31 DIAGNOSIS — R079 Chest pain, unspecified: Secondary | ICD-10-CM

## 2022-01-31 DIAGNOSIS — E118 Type 2 diabetes mellitus with unspecified complications: Secondary | ICD-10-CM

## 2022-01-31 DIAGNOSIS — E782 Mixed hyperlipidemia: Secondary | ICD-10-CM

## 2022-02-02 ENCOUNTER — Ambulatory Visit: Payer: Medicare Other | Admitting: Cardiology

## 2022-02-04 ENCOUNTER — Ambulatory Visit: Payer: PRIVATE HEALTH INSURANCE | Attending: Cardiovascular Disease | Admitting: Nurse Practitioner

## 2022-02-04 ENCOUNTER — Encounter: Payer: Self-pay | Admitting: Nurse Practitioner

## 2022-02-04 VITALS — BP 118/72 | HR 108 | Ht 65.0 in | Wt 230.0 lb

## 2022-02-04 DIAGNOSIS — E782 Mixed hyperlipidemia: Secondary | ICD-10-CM

## 2022-02-04 DIAGNOSIS — I10 Essential (primary) hypertension: Secondary | ICD-10-CM | POA: Diagnosis not present

## 2022-02-04 DIAGNOSIS — I499 Cardiac arrhythmia, unspecified: Secondary | ICD-10-CM

## 2022-02-04 DIAGNOSIS — E118 Type 2 diabetes mellitus with unspecified complications: Secondary | ICD-10-CM | POA: Diagnosis not present

## 2022-02-04 DIAGNOSIS — R072 Precordial pain: Secondary | ICD-10-CM | POA: Diagnosis not present
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
## 2022-02-04 NOTE — Patient Instructions (Signed)
Medication Instructions:   Your physician recommends that you continue on your current medications as directed. Please refer to the Current Medication list given to you today.  *If you need a refill on your cardiac medications before your next appointment, please call your pharmacy*   Lab Work:  None Ordered  If you have labs (blood work) drawn today and your tests are completely normal, you will receive your results only by: [place] (if you have MyChart) OR A paper copy in the mail If you have any lab test that is abnormal or we need to change your treatment, we will call you to review the results.   Testing/Procedures:  Echocardiogram - sooner rather than later - per Gerald Stabs. Patient is willing to go to [place].   Your physician has requested that you have an echocardiogram. Echocardiography is a painless test that uses sound waves to create images of your heart. It provides your doctor with information about the size and shape of your heart and how well your heart's chambers and valves are working. This procedure takes approximately one hour. There are no restrictions for this procedure. Please note; depending on visual quality an IV may need to be placed.     Follow-Up: At [facility], you and your health needs are our priority.  As part of our continuing mission to provide you with exceptional heart care, we have created designated Provider Care Teams.  These Care Teams include your primary Cardiologist (physician) and Advanced Practice Providers (APPs -  Physician Assistants and Nurse Practitioners) who all work together to provide you with the care you need, when you need it.  We recommend signing up for the patient portal called "MyChart".  Sign up information is provided on this After Visit Summary.  MyChart is used to connect with patients for Virtual Visits (Telemedicine).  Patients are able to view lab/test results, encounter notes, upcoming appointments,  etc.  Non-urgent messages can be sent to your provider as well.   To learn more about what you can do with MyChart, go to [email or web address].    Your next appointment:   4 - 6 week(s)  The format for your next appointment:   In Person  Provider:   You may see Ida Rogue, MD or one of the following Advanced Practice Providers on your designated Care Team:   Murray Hodgkins, NP Christell Faith, PA-C Cadence Kathlen Mody, PA-C Gerrie Nordmann, NP

## 2022-02-17 ENCOUNTER — Ambulatory Visit (HOSPITAL_COMMUNITY): Payer: Medicare Other | Attending: Cardiovascular Disease

## 2022-02-17 DIAGNOSIS — R072 Precordial pain: Secondary | ICD-10-CM | POA: Insufficient documentation

## 2022-02-17 LAB — ECHOCARDIOGRAM COMPLETE
Area-P 1/2: 3.99 cm2
S' Lateral: 2.4 cm

## 2022-03-11 ENCOUNTER — Encounter: Payer: Self-pay | Admitting: Nurse Practitioner

## 2022-03-11 ENCOUNTER — Ambulatory Visit: Payer: Medicare Other | Attending: Nurse Practitioner | Admitting: Nurse Practitioner

## 2022-03-11 VITALS — BP 110/80 | HR 86 | Ht 65.0 in | Wt 223.8 lb

## 2022-03-11 DIAGNOSIS — I10 Essential (primary) hypertension: Secondary | ICD-10-CM | POA: Diagnosis not present

## 2022-03-11 DIAGNOSIS — E118 Type 2 diabetes mellitus with unspecified complications: Secondary | ICD-10-CM

## 2022-03-11 DIAGNOSIS — R0789 Other chest pain: Secondary | ICD-10-CM | POA: Diagnosis not present

## 2022-03-11 DIAGNOSIS — E782 Mixed hyperlipidemia: Secondary | ICD-10-CM
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
## 2022-03-11 NOTE — Patient Instructions (Signed)
Medication Instructions:  Your Physician recommend you continue on your current medication as directed.    *If you need a refill on your cardiac medications before your next appointment, please call your pharmacy*   Lab Work: None ordered today   Testing/Procedures: None ordered today   Follow-Up: At [facility], you and your health needs are our priority.  As part of our continuing mission to provide you with exceptional heart care, we have created designated Provider Care Teams.  These Care Teams include your primary Cardiologist (physician) and Advanced Practice Providers (APPs -  Physician Assistants and Nurse Practitioners) who all work together to provide you with the care you need, when you need it.  We recommend signing up for the patient portal called "MyChart".  Sign up information is provided on this After Visit Summary.  MyChart is used to connect with patients for Virtual Visits (Telemedicine).  Patients are able to view lab/test results, encounter notes, upcoming appointments, etc.  Non-urgent messages can be sent to your provider as well.   To learn more about what you can do with MyChart, go to [email or web address].    Your next appointment:   1 year(s)  The format for your next appointment:   In Person  Provider:   You may see Julien Nordmann, MD or one of the following Advanced Practice Providers on your designated Care Team:   Nicolasa Ducking, NP Eula Listen, PA-C Cadence Fransico Michael, [place] Charlsie Quest, NP    Other Instructions    '

## 2022-10-26 ENCOUNTER — Other Ambulatory Visit: Payer: Self-pay | Admitting: Family Medicine

## 2022-10-26 DIAGNOSIS — Z1231 Encounter for screening mammogram for malignant neoplasm of breast: Secondary | ICD-10-CM

## 2022-12-28 ENCOUNTER — Ambulatory Visit
Admission: RE | Admit: 2022-12-28 | Discharge: 2022-12-28 | Disposition: A | Discharge status: Home / Self Care | Payer: Medicare Other | Source: Ambulatory Visit | Attending: Family Medicine | Admitting: Family Medicine

## 2022-12-28 DIAGNOSIS — Z1231 Encounter for screening mammogram for malignant neoplasm of breast: Secondary | ICD-10-CM | POA: Insufficient documentation

## 2023-09-07 LAB — HEMOGLOBIN A1C: Hemoglobin A1C: 9.8

## 2023-09-12 ENCOUNTER — Ambulatory Visit: Admitting: Family Medicine

## 2023-10-10 ENCOUNTER — Ambulatory Visit: Admitting: Family Medicine

## 2023-10-10 DIAGNOSIS — S92331A Displaced fracture of third metatarsal bone, right foot, initial encounter for closed fracture: Secondary | ICD-10-CM | POA: Diagnosis not present

## 2023-10-10 DIAGNOSIS — S99921A Unspecified injury of right foot, initial encounter: Secondary | ICD-10-CM | POA: Diagnosis not present

## 2023-10-17 DIAGNOSIS — Z961 Presence of intraocular lens: Secondary | ICD-10-CM | POA: Diagnosis not present

## 2023-10-17 DIAGNOSIS — H353123 Nonexudative age-related macular degeneration, left eye, advanced atrophic without subfoveal involvement: Secondary | ICD-10-CM | POA: Diagnosis not present

## 2023-10-17 DIAGNOSIS — H353211 Exudative age-related macular degeneration, right eye, with active choroidal neovascularization: Secondary | ICD-10-CM | POA: Diagnosis not present

## 2023-10-17 DIAGNOSIS — Z0389 Encounter for observation for other suspected diseases and conditions ruled out: Secondary | ICD-10-CM | POA: Diagnosis not present

## 2023-10-17 LAB — HM DIABETES EYE EXAM

## 2023-10-19 DIAGNOSIS — E118 Type 2 diabetes mellitus with unspecified complications: Secondary | ICD-10-CM | POA: Diagnosis not present

## 2023-10-19 DIAGNOSIS — S92514A Nondisplaced fracture of proximal phalanx of right lesser toe(s), initial encounter for closed fracture: Secondary | ICD-10-CM | POA: Diagnosis not present

## 2023-10-30 ENCOUNTER — Encounter: Payer: Self-pay | Admitting: Family Medicine

## 2023-11-17 ENCOUNTER — Ambulatory Visit: Attending: Student | Admitting: Student

## 2023-11-17 ENCOUNTER — Encounter: Payer: Self-pay | Admitting: Student

## 2023-11-17 VITALS — BP 134/72 | HR 72 | Ht 64.0 in | Wt 249.0 lb

## 2023-11-17 DIAGNOSIS — G4733 Obstructive sleep apnea (adult) (pediatric): Secondary | ICD-10-CM

## 2023-11-17 DIAGNOSIS — E782 Mixed hyperlipidemia: Secondary | ICD-10-CM | POA: Diagnosis not present

## 2023-11-17 DIAGNOSIS — Z79899 Other long term (current) drug therapy: Secondary | ICD-10-CM

## 2023-11-17 DIAGNOSIS — I10 Essential (primary) hypertension: Secondary | ICD-10-CM

## 2023-11-17 NOTE — Patient Instructions (Signed)
 Medication Instructions:  Your physician recommends that you continue on your current medications as directed. Please refer to the Current Medication list given to you today.   *If you need a refill on your cardiac medications before your next appointment, please call your pharmacy*  Lab Work: No labs ordered today  If you have labs (blood work) drawn today and your tests are completely normal, you will receive your results only by: MyChart Message (if you have MyChart) OR A paper copy in the mail If you have any lab test that is abnormal or we need to change your treatment, we will call you to review the results.  Testing/Procedures: No test ordered today   Follow-Up: At [facility], you and your health needs are our priority.  As part of our continuing mission to provide you with exceptional heart care, our providers are all part of one team.  This team includes your primary Cardiologist (physician) and Advanced Practice Providers or APPs (Physician Assistants and Nurse Practitioners) who all work together to provide you with the care you need, when you need it.  Your next appointment:   12 month(s)  Provider:   Timothy Gollan, MD or Barnie Hila, NP    We recommend signing up for the patient portal called MyChart.  Sign up information is provided on this After Visit Summary.  MyChart is used to connect with patients for Virtual Visits (Telemedicine).  Patients are able to view lab/test results, encounter notes, upcoming appointments, etc.  Non-urgent messages can be sent to your provider as well.   To learn more about what you can do with MyChart, go to [email or web address].
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
## 2023-11-21 ENCOUNTER — Ambulatory Visit: Admitting: Family Medicine

## 2023-11-21 ENCOUNTER — Encounter: Payer: Self-pay | Admitting: Family Medicine

## 2023-11-21 VITALS — BP 122/72 | HR 85 | Resp 16 | Ht 64.0 in | Wt 248.0 lb

## 2023-11-21 DIAGNOSIS — Z1159 Encounter for screening for other viral diseases: Secondary | ICD-10-CM | POA: Diagnosis not present

## 2023-11-21 DIAGNOSIS — N200 Calculus of kidney: Secondary | ICD-10-CM | POA: Diagnosis not present

## 2023-11-21 DIAGNOSIS — Z0000 Encounter for general adult medical examination without abnormal findings: Secondary | ICD-10-CM

## 2023-11-21 DIAGNOSIS — Z23 Encounter for immunization: Secondary | ICD-10-CM

## 2023-11-21 DIAGNOSIS — R32 Unspecified urinary incontinence: Secondary | ICD-10-CM | POA: Insufficient documentation

## 2023-11-21 DIAGNOSIS — E1159 Type 2 diabetes mellitus with other circulatory complications: Secondary | ICD-10-CM | POA: Diagnosis not present

## 2023-11-21 DIAGNOSIS — E1169 Type 2 diabetes mellitus with other specified complication: Secondary | ICD-10-CM | POA: Diagnosis not present

## 2023-11-21 DIAGNOSIS — Z7984 Long term (current) use of oral hypoglycemic drugs: Secondary | ICD-10-CM | POA: Diagnosis not present

## 2023-11-21 DIAGNOSIS — E118 Type 2 diabetes mellitus with unspecified complications: Secondary | ICD-10-CM

## 2023-11-21 DIAGNOSIS — Z0001 Encounter for general adult medical examination with abnormal findings: Secondary | ICD-10-CM | POA: Diagnosis not present

## 2023-11-21 DIAGNOSIS — K219 Gastro-esophageal reflux disease without esophagitis: Secondary | ICD-10-CM

## 2023-11-21 DIAGNOSIS — E782 Mixed hyperlipidemia: Secondary | ICD-10-CM

## 2023-11-21 DIAGNOSIS — R059 Cough, unspecified: Secondary | ICD-10-CM

## 2023-11-21 DIAGNOSIS — F4322 Adjustment disorder with anxiety: Secondary | ICD-10-CM

## 2023-11-21 DIAGNOSIS — F4320 Adjustment disorder, unspecified: Secondary | ICD-10-CM

## 2023-11-21 MED ORDER — OMEPRAZOLE 20 MG PO CPDR: 20.0000 mg | DELAYED_RELEASE_CAPSULE | Freq: Every day | ORAL | 1 refills | Status: AC

## 2023-11-21 MED ORDER — LOSARTAN POTASSIUM 50 MG PO TABS: 50.0000 mg | ORAL_TABLET | Freq: Every day | ORAL | 0 refills | Status: DC

## 2023-11-21 MED ORDER — ATORVASTATIN CALCIUM 10 MG PO TABS: 10.0000 mg | ORAL_TABLET | Freq: Every day | ORAL | 1 refills | Status: DC

## 2023-11-21 MED ORDER — SERTRALINE HCL 100 MG PO TABS: 100.0000 mg | ORAL_TABLET | Freq: Every day | ORAL | 3 refills | Status: AC

## 2023-11-21 NOTE — Assessment & Plan Note (Signed)
 Controlled sx per pt with omeprazole  With cough if no improvement may consider change in tx to see if cough may be related to GERD?  For now no changes

## 2023-11-21 NOTE — Assessment & Plan Note (Signed)
 BMI 42 with multiple associated conditions Htn, HLD, DM, OSA, asthma

## 2023-11-21 NOTE — Progress Notes (Signed)
 Name: Miranda Pollard   MRN: [id number]    DOB: [birth year]   Date:11/21/2023       Progress Note  Chief Complaint  Patient presents with   Establish Care     Subjective:   Miranda Pollard is a 73 y.o. female, presents to clinic to establish care  Maryl Favor primary care - last PCP OV July 2024, reviewed that visit, dx, labs and meds  She sees endocrinology - Dr. Cherilyn 9.8 [place] 66 units - but taking differently 33 units am and lunchtime, mealtime humalog 15-17 units, metformin, actos, glimepiride Infections with jardiance GI SE with GLP's Low blood sugar episodes often limiting GM management, low sugar 47  HLD managed on lipitor 10 mg Not on recent med dispense list, pt states she has a few pills left? Lipid panel from last OV July 2024 LDL 57, total cholesterol 876  HTN managed on lisinopril 20 mg BP Readings from Last 3 Encounters:  11/21/23 122/72  11/17/23 134/72  03/11/22 110/80    Started SSRI zoloft  many years ago with failing health of husband and father, multiple deaths, works well for her    11/21/2023    9:00 AM  Depression screen PHQ 2/9  Decreased Interest 0  Down, Depressed, Hopeless 0  PHQ - 2 Score 0  Altered sleeping 0  Tired, decreased energy 0  Change in appetite 0  Feeling bad or failure about yourself  0  Trouble concentrating 0  Moving slowly or fidgety/restless 0  Suicidal thoughts 0  PHQ-9 Score 0    Asthma - albuterol , she cannot remember the last exacerbation she had, she reports chronic cough wants to stop lisinopril which she has been on for decades Allergies - On zyrtec and flonase  prn She denies any recent worsening or recent URI  GERD on omeprazole  20 mg daily - states it is well controlled  Discussed the use of AI scribe software for clinical note transcription with the patient, who gave verbal consent to proceed.  History of Present Illness Miranda Pollard is a 73 year old female with type 2 diabetes who  presents for management of her diabetes and associated symptoms.  Glycemic control and hypoglycemia - Type 2 diabetes managed with Tresiba 66 units daily (split between breakfast and lunch), Humalog 15-17 units with meals, metformin, glimepiride, and Actos - Episodes of hypoglycemia, with lowest blood glucose recorded at 47 mg/dL at night - Continuous glucose monitor in use; low readings confirmed with finger sticks - Carries glucose tablets and regular Coke for hypoglycemic emergencies - Previous use of Victoza and Trulicity resulted in abdominal side effects; Jardiance caused yeast infections  Hypertension and associated cough - Hypertension managed with lisinopril since the 1980s - Persistent dry cough, possibly related to lisinopril  Obstructive sleep apnea and asthma - Obstructive sleep apnea present - Rescue inhaler used as needed for asthma - No recent asthma exacerbations  Gastroesophageal reflux and medication sensitivity - Acid reflux managed with omeprazole  20 mg daily - Adverse reactions to medications containing blue dye; avoids such medications  Mood symptoms - Anxiety and depression managed with sertraline  100 mg daily - Mood symptoms began during a period of significant personal stress  Urinary incontinence and urolithiasis - Urinary incontinence onset in May, worsened after recent kidney stone passage - Uses adult incontinence products - Experiences leakage with urgency and exertion  Visual disturbance - Age-related macular degeneration - Receives Avastin injections from a retinologist  Current Outpatient Medications:    acetaminophen  (TYLENOL ) 650 MG CR tablet, Take by mouth as needed. , Disp: , Rfl:    albuterol  (PROVENTIL  HFA;VENTOLIN  HFA) 108 (90 Base) MCG/ACT inhaler, Inhale 2 puffs into the lungs every 4 (four) hours as needed for wheezing or shortness of breath., Disp: 1 Inhaler, Rfl: 0   aspirin  EC 81 MG tablet, Take 81 mg by mouth daily.,  Disp: , Rfl:    atorvastatin  (LIPITOR) 10 MG tablet, Take 10 mg by mouth daily., Disp: , Rfl:    bevacizumab (AVASTIN) 1.25 mg/0.1 mL SOLN, Every 13-14 wks right eye., Disp: , Rfl:    cetirizine (ZYRTEC) 10 MG tablet, Take 10 mg by mouth daily., Disp: , Rfl:    Cyanocobalamin (VITAMIN B 12 PO), Take by mouth daily., Disp: , Rfl:    fluticasone  (FLONASE ) 50 MCG/ACT nasal spray, Place 2 sprays into both nostrils daily., Disp: 16 g, Rfl: 0   glimepiride (AMARYL) 1 MG tablet, Take 1 mg by mouth 2 (two) times daily., Disp: , Rfl:    HUMALOG KWIKPEN 100 UNIT/ML KwikPen, Inject 15 Units into the skin 3 (three) times daily before meals., Disp: , Rfl:    insulin  degludec (TRESIBA FLEXTOUCH) 100 UNIT/ML FlexTouch Pen, INJECT 20 UNITS UNDER THE SKIN ONCE A DAY. TITRATE TO A MAX DOSE OF 50 UNITS DAILY, Disp: , Rfl:    lisinopril (PRINIVIL,ZESTRIL) 20 MG tablet, Take 20 mg by mouth daily., Disp: , Rfl:    loperamide (IMODIUM A-D) 2 MG tablet, Take 2 mg by mouth as needed for diarrhea or loose stools., Disp: , Rfl:    metFORMIN (GLUMETZA) 1000 MG (MOD) 24 hr tablet, Take 1,000 mg by mouth 2 (two) times daily with a meal. , Disp: , Rfl:    Multiple Vitamins-Minerals (OCUVITE PO), Take 1 tablet by mouth daily., Disp: , Rfl:    OMEPRAZOLE  PO, Take 20 mg by mouth daily., Disp: , Rfl:    pioglitazone (ACTOS) 15 MG tablet, Take 15 mg by mouth daily., Disp: , Rfl:    sertraline  (ZOLOFT ) 50 MG tablet, Take 50 mg by mouth daily., Disp: , Rfl:   Patient Active Problem List   Diagnosis Date Noted   Morbid obesity (HCC) 01/02/2018   Hyperlipidemia 01/02/2018   Type 2 diabetes mellitus with complication, without long-term current use of insulin  (HCC) 01/02/2018   Adjustment disorder 12/31/2017   Family history of breast cancer 09/07/2016   Hypertension associated with diabetes (HCC) 11/21/2013   Sleep apnea 11/01/2013   Hyperlipidemia due to type 2 diabetes mellitus (HCC) 11/01/2013    Past Surgical History:   Procedure Laterality Date   CARDIAC CATHETERIZATION  05/2007   No stents [facility]   CARPAL TUNNEL RELEASE  1980's   CATARACT EXTRACTION W/PHACO Left 08/27/2018   Procedure: CATARACT EXTRACTION PHACO AND INTRAOCULAR LENS PLACEMENT (IOC)  LEFT DIABETIC;  Surgeon: Myrna Adine Anes, MD;  Location[facility];  Service: Ophthalmology;  Laterality: Left;  Diabetic - oral meds sleep apnea   CATARACT EXTRACTION W/PHACO Right 09/24/2018   Procedure: CATARACT EXTRACTION PHACO AND INTRAOCULAR LENS PLACEMENT (IOC)  RIGHT DIABETIC;  Surgeon: Myrna Adine Anes, MD;  Location: [facility];  Service: Ophthalmology;  Laterality: Right;  diabetic - insulin  and oral meds   CHOLECYSTECTOMY, LAPAROSCOPIC  2002   COLONOSCOPY  07/2014   ESOPHAGOGASTRODUODENOSCOPY ENDOSCOPY  07/2014   HYSTEROSCOPY  2004   cystic hyperplasia   HYSTEROSCOPY  2008   postemopausa bleeding weakly proliferative endometrium on pathology  LAPAROSCOPY  1993   Laparoscopy and laparotomy for ovarian cysts and fibroid    Family History  Problem Relation Age of Onset   Hypertension Mother    Melanoma Mother 65   Heart Problems Mother    Asthma Father    Congestive Heart Failure Father    COPD Father    Diabetes Father    Emphysema Father    Diabetes Sister    Breast cancer Sister 41   Atrial fibrillation Sister    Hypertension Brother    Sleep apnea Brother    Heart attack Maternal Uncle    Colon cancer Maternal Grandmother    Heart attack Maternal Grandfather    Breast cancer Cousin 60       2 pat cousins   Social History   Socioeconomic History   Marital status: Widowed    Spouse name: Not on file   Number of children: Not on file   Years of education: Not on file   Highest education level: Not on file  Occupational History   Not on file  Tobacco Use   Smoking status: Never   Smokeless tobacco: Never  Vaping Use   Vaping status: Never Used  Substance and Sexual Activity   Alcohol use: No    Drug use: No   Sexual activity: Not Currently    Birth control/protection: Post-menopausal  Other Topics Concern   Not on file  Social History Narrative   Not on file   Social Drivers of Health   Financial Resource Strain: Low Risk  (11/21/2023)   Overall Financial Resource Strain (CARDIA)    Difficulty of Paying Living Expenses: Not hard at all  Food Insecurity: No Food Insecurity (11/21/2023)   Hunger Vital Sign    Worried About Running Out of Food in the Last Year: Never true    Ran Out of Food in the Last Year: Never true  Transportation Needs: No Transportation Needs (11/21/2023)   PRAPARE - Administrator, Civil Service (Medical): No    Lack of Transportation (Non-Medical): No  Physical Activity: Sufficiently Active (11/21/2023)   Exercise Vital Sign    Days of Exercise per Week: 7 days    Minutes of Exercise per Session: 30 min  Stress: No Stress Concern Present (11/21/2023)   [employer] of Occupational Health - Occupational Stress Questionnaire    Feeling of Stress: Not at all  Social Connections: Moderately Integrated (11/21/2023)   Social Connection and Isolation Panel    Frequency of Communication with Friends and Family: More than three times a week    Frequency of Social Gatherings with Friends and Family: More than three times a week    Attends Religious Services: More than 4 times per year    Active Member of [employer] or Organizations: Yes    Attends Banker Meetings: More than 4 times per year    Marital Status: Widowed  Intimate Partner Violence: Not At Risk (11/21/2023)   Humiliation, Afraid, Rape, and Kick questionnaire    Fear of Current or Ex-Partner: No    Emotionally Abused: No    Physically Abused: No    Sexually Abused: No     Allergies  Allergen Reactions   Blue Dyes (Parenteral) Anaphylaxis   Ciprofloxacin Nausea And Vomiting   Hydrocodone Palpitations    Health Maintenance  Topic Date Due   Medicare Annual Wellness  (AWV)  Never done   Diabetic kidney evaluation - Urine ACR  Never done   Hepatitis  C Screening  Never done   Diabetic kidney evaluation - eGFR measurement  01/21/2023   COVID-19 Vaccine (7 - Mixed Product risk 2024-25 season) 12/06/2023 (Originally 08/22/2023)   INFLUENZA VACCINE  07/09/2024 (Originally 11/10/2023)   Zoster Vaccines- Shingrix (2 of 2) 01/08/2024   HEMOGLOBIN A1C  03/09/2024   Colonoscopy  07/24/2024   OPHTHALMOLOGY EXAM  10/16/2024   FOOT EXAM  11/20/2024   MAMMOGRAM  12/27/2024   DTaP/Tdap/Td (3 - Td or Tdap) 01/21/2032   Pneumococcal Vaccine: 50+ Years  Completed   DEXA SCAN  Completed   Hepatitis B Vaccines  Aged Out   HPV VACCINES  Aged Out   Meningococcal B Vaccine  Aged Out    Chart Review Today: I personally reviewed active problem list, medication list, allergies, family history, social history, health maintenance, notes from last encounter, lab results, imaging with the patient/caregiver today.   Review of Systems  Constitutional: Negative.   HENT: Negative.    Eyes: Negative.   Respiratory: Negative.    Cardiovascular: Negative.   Gastrointestinal: Negative.   Endocrine: Negative.   Genitourinary: Negative.   Musculoskeletal: Negative.   Skin: Negative.   Allergic/Immunologic: Negative.   Neurological: Negative.   Hematological: Negative.   Psychiatric/Behavioral: Negative.    All other systems reviewed and are negative.    Objective:   Vitals:   11/21/23 0925  BP: 122/72  Pulse: 85  Resp: 16  SpO2: 98%  Weight: 248 lb (112.5 kg)  Height: 5' 4 (1.626 m)    Body mass index is 42.57 kg/m.  Physical Exam Vitals and nursing note reviewed.  Constitutional:      General: She is not in acute distress.    Appearance: Normal appearance. She is well-developed. She is obese. She is not ill-appearing, toxic-appearing or diaphoretic.  HENT:     Head: Normocephalic and atraumatic.     Right Ear: External ear normal.     Left Ear: External  ear normal.     Nose: Nose normal.  Eyes:     General: No scleral icterus.       Right eye: No discharge.        Left eye: No discharge.     Conjunctiva/sclera: Conjunctivae normal.  Neck:     Trachea: No tracheal deviation.  Cardiovascular:     Rate and Rhythm: Normal rate and regular rhythm.  Pulmonary:     Effort: Pulmonary effort is normal. No respiratory distress.     Breath sounds: Normal breath sounds. No stridor. No wheezing, rhonchi or rales.  Musculoskeletal:     Comments: Right wrist in bandage/coband  Skin:    General: Skin is warm and dry.     Findings: No rash.  Neurological:     Mental Status: She is alert.     Motor: No abnormal muscle tone.     Coordination: Coordination normal.     Gait: Gait normal.  Psychiatric:        Mood and Affect: Mood normal.        Behavior: Behavior normal.      Functional Status Survey: Is the patient deaf or have difficulty hearing?: No Does the patient have difficulty seeing, even when wearing glasses/contacts?: Yes Does the patient have difficulty concentrating, remembering, or making decisions?: No Does the patient have difficulty walking or climbing stairs?: No Does the patient have difficulty dressing or bathing?: No Does the patient have difficulty doing errands alone such as visiting a doctor's office or shopping?: No Results  for orders placed or performed in visit on 11/20/23  HM COLONOSCOPY   Collection Time: 07/25/14 12:00 AM  Result Value Ref Range   HM Colonoscopy See Report (in chart) See Report (in chart), Patient Reported  HM DEXA SCAN   Collection Time: 02/14/20 12:00 AM  Result Value Ref Range   HM Dexa Scan See Chart       Assessment & Plan:   Encounter for medical examination to establish care -     CBC with Differential/Platelet -     Comprehensive metabolic panel with GFR -     Hemoglobin A1c -     Lipid panel  Type 2 diabetes mellitus with complication, without long-term current use of  insulin  (HCC) Assessment & Plan: Uncontrolled managed by endocrinology On basal and mealtime insulin , having some low episodes Reviewed last endocrinology OV consult, plan and labs  Orders: -     Comprehensive metabolic panel with GFR -     Hemoglobin A1c -     Microalbumin / creatinine urine ratio -     HM Diabetes Foot Exam  Morbid obesity (HCC) Assessment & Plan: BMI 42 with multiple associated conditions Htn, HLD, DM, OSA, asthma  Orders: -     Lipid panel  Mixed hyperlipidemia Assessment & Plan: Supposed to be on statin unclear compliance or med supply Labs from last PCP OV lipids were well controlled Recheck lipids today, refill meds with current dose per chart and will likely need to recheck in 6 months if lipids were uncontrolled  Orders: -     Lipid panel  Adjustment disorder with anxious mood Assessment & Plan: She reports being on zoloft  for many years that works well w/o side effects or concerns Will take over refills from prior PCP PHQ 9 reviewed and meds refiled    11/21/2023    9:00 AM  Depression screen PHQ 2/9  Decreased Interest 0  Down, Depressed, Hopeless 0  PHQ - 2 Score 0  Altered sleeping 0  Tired, decreased energy 0  Change in appetite 0  Feeling bad or failure about yourself  0  Trouble concentrating 0  Moving slowly or fidgety/restless 0  Suicidal thoughts 0  PHQ-9 Score 0      Screening for viral disease -     Hepatitis C antibody  Immunization due -     Pneumococcal conjugate vaccine 20-valent  Hypertension associated with diabetes (HCC) Assessment & Plan: BP well controlled, however she would like to d/c lisinopril due to coughing and try different med (losartan ?) BP Readings from Last 3 Encounters:  11/21/23 122/72  11/17/23 134/72  03/11/22 110/80  Will need to f/up in 1 month for BP control, SE, possible labs Losartan  Potassium; Take 1 tablet (50 mg total) by mouth daily.  Dispense: 90 tablet; Refill:  0    Hyperlipidemia due to type 2 diabetes mellitus (HCC) -     Atorvastatin  Calcium ; Take 1 tablet (10 mg total) by mouth at bedtime.  Dispense: 90 tablet; Refill: 1  Adjustment disorder, unspecified type Assessment & Plan: She reports being on zoloft  for many years that works well w/o side effects or concerns Will take over refills from prior PCP PHQ 9 reviewed and meds refiled    11/21/2023    9:00 AM  Depression screen PHQ 2/9  Decreased Interest 0  Down, Depressed, Hopeless 0  PHQ - 2 Score 0  Altered sleeping 0  Tired, decreased energy 0  Change in appetite 0  Feeling bad  or failure about yourself  0  Trouble concentrating 0  Moving slowly or fidgety/restless 0  Suicidal thoughts 0  PHQ-9 Score 0     Orders: -     Sertraline  HCl; Take 1 tablet (100 mg total) by mouth daily.  Dispense: 90 tablet; Refill: 3  Gastroesophageal reflux disease, unspecified whether esophagitis present Assessment & Plan: Controlled sx per pt with omeprazole  With cough if no improvement may consider change in tx to see if cough may be related to GERD?  For now no changes  Orders: -     Omeprazole ; Take 1 capsule (20 mg total) by mouth daily.  Dispense: 90 capsule; Refill: 1  Urinary incontinence, unspecified type Assessment & Plan: She reports new recent mixed urge and stress incontinence Consult urology  Orders: -     Ambulatory referral to Urology  Nephrolithiasis Assessment & Plan: Hx a kidney stones needs to reestablish with urology  Orders: -     Ambulatory referral to Urology  Cough, unspecified type - discussed various etiologies and approaches For now she would like to d/c lisinopril to see if that could have been causing cough Discussed adjusting allergy meds, GERD meds, f/up for asthma with cough variant? Will change lisinopril to losartan  and do close f/up       Return for 1 month BP cough f/up.   Michelene Cower, PA-C 11/21/23 9:56 AM

## 2023-11-21 NOTE — Assessment & Plan Note (Signed)
 Hx a kidney stones needs to reestablish with urology

## 2023-11-21 NOTE — Assessment & Plan Note (Signed)
 Uncontrolled managed by endocrinology On basal and mealtime insulin , having some low episodes Reviewed last endocrinology OV consult, plan and labs

## 2023-11-21 NOTE — Assessment & Plan Note (Signed)
 BP well controlled, however she would like to d/c lisinopril due to coughing and try different med (losartan ?) BP Readings from Last 3 Encounters:  11/21/23 122/72  11/17/23 134/72  03/11/22 110/80  Will need to f/up in 1 month for BP control, SE, possible labs

## 2023-11-21 NOTE — Assessment & Plan Note (Signed)
 She reports new recent mixed urge and stress incontinence Consult urology

## 2023-11-21 NOTE — Assessment & Plan Note (Signed)
 She reports being on zoloft  for many years that works well w/o side effects or concerns Will take over refills from prior PCP PHQ 9 reviewed and meds refiled    11/21/2023    9:00 AM  Depression screen PHQ 2/9  Decreased Interest 0  Down, Depressed, Hopeless 0  PHQ - 2 Score 0  Altered sleeping 0  Tired, decreased energy 0  Change in appetite 0  Feeling bad or failure about yourself  0  Trouble concentrating 0  Moving slowly or fidgety/restless 0  Suicidal thoughts 0  PHQ-9 Score 0

## 2023-11-21 NOTE — Assessment & Plan Note (Signed)
 Supposed to be on statin unclear compliance or med supply Labs from last PCP OV lipids were well controlled Recheck lipids today, refill meds with current dose per chart and will likely need to recheck in 6 months if lipids were uncontrolled

## 2023-11-22 LAB — CBC WITH DIFFERENTIAL/PLATELET
Absolute Lymphocytes: 3248 {cells}/uL (ref 850–3900)
Absolute Monocytes: 794 {cells}/uL (ref 200–950)
Basophils Absolute: 73 {cells}/uL (ref 0–200)
Basophils Relative: 0.9 %
Eosinophils Absolute: 186 {cells}/uL (ref 15–500)
Eosinophils Relative: 2.3 %
HCT: 45.9 % — ABNORMAL HIGH (ref 35.0–45.0)
Hemoglobin: 14.5 g/dL (ref 11.7–15.5)
MCH: 28.4 pg (ref 27.0–33.0)
MCHC: 31.6 g/dL — ABNORMAL LOW (ref 32.0–36.0)
MCV: 89.8 fL (ref 80.0–100.0)
MPV: 9.7 fL (ref 7.5–12.5)
Monocytes Relative: 9.8 %
Neutro Abs: 3799 {cells}/uL (ref 1500–7800)
Neutrophils Relative %: 46.9 %
Platelets: 299 Thousand/uL (ref 140–400)
RBC: 5.11 Million/uL — ABNORMAL HIGH (ref 3.80–5.10)
RDW: 13.3 % (ref 11.0–15.0)
Total Lymphocyte: 40.1 %
WBC: 8.1 Thousand/uL (ref 3.8–10.8)

## 2023-11-22 LAB — COMPREHENSIVE METABOLIC PANEL WITH GFR
AG Ratio: 1.3 (calc) (ref 1.0–2.5)
ALT: 24 U/L (ref 6–29)
AST: 22 U/L (ref 10–35)
Albumin: 4 g/dL (ref 3.6–5.1)
Alkaline phosphatase (APISO): 53 U/L (ref 37–153)
BUN: 14 mg/dL (ref 7–25)
CO2: 31 mmol/L (ref 20–32)
Calcium: 9.6 mg/dL (ref 8.6–10.4)
Chloride: 99 mmol/L (ref 98–110)
Creat: 0.6 mg/dL (ref 0.60–1.00)
Globulin: 3 g/dL (ref 1.9–3.7)
Glucose, Bld: 158 mg/dL — ABNORMAL HIGH (ref 65–99)
Potassium: 5 mmol/L (ref 3.5–5.3)
Sodium: 138 mmol/L (ref 135–146)
Total Bilirubin: 0.5 mg/dL (ref 0.2–1.2)
Total Protein: 7 g/dL (ref 6.1–8.1)
eGFR: 97 mL/min/1.73m2 (ref >=60)

## 2023-11-22 LAB — HEPATITIS C ANTIBODY: Hepatitis C Ab: NONREACTIVE

## 2023-11-22 LAB — MICROALBUMIN / CREATININE URINE RATIO
Creatinine, Urine: 25 mg/dL (ref 20–275)
Microalb, Ur: <0.2 mg/dL

## 2023-11-22 LAB — LIPID PANEL
Cholesterol: 163 mg/dL (ref <200)
HDL: 53 mg/dL (ref >=50)
LDL Cholesterol (Calc): 93 mg/dL
Non-HDL Cholesterol (Calc): 110 mg/dL (ref <130)
Total CHOL/HDL Ratio: 3.1 (calc) (ref <5.0)
Triglycerides: 79 mg/dL (ref <150)

## 2023-11-22 LAB — HEMOGLOBIN A1C
Hgb A1c MFr Bld: 8.7 % — ABNORMAL HIGH (ref <5.7)
Mean Plasma Glucose: 203 mg/dL
eAG (mmol/L): 11.2 mmol/L

## 2023-11-29 ENCOUNTER — Ambulatory Visit: Payer: Self-pay | Admitting: Family Medicine

## 2023-12-22 ENCOUNTER — Ambulatory Visit: Admitting: Family Medicine

## 2023-12-22 ENCOUNTER — Encounter: Payer: Self-pay | Admitting: Family Medicine

## 2023-12-22 VITALS — BP 120/78 | HR 89 | Resp 16 | Ht 64.0 in | Wt 250.0 lb

## 2023-12-22 DIAGNOSIS — I152 Hypertension secondary to endocrine disorders: Secondary | ICD-10-CM

## 2023-12-22 DIAGNOSIS — E1159 Type 2 diabetes mellitus with other circulatory complications: Secondary | ICD-10-CM | POA: Diagnosis not present

## 2023-12-22 DIAGNOSIS — E1169 Type 2 diabetes mellitus with other specified complication: Secondary | ICD-10-CM | POA: Diagnosis not present

## 2023-12-22 DIAGNOSIS — Z7984 Long term (current) use of oral hypoglycemic drugs: Secondary | ICD-10-CM

## 2023-12-22 DIAGNOSIS — K219 Gastro-esophageal reflux disease without esophagitis: Secondary | ICD-10-CM | POA: Diagnosis not present

## 2023-12-22 DIAGNOSIS — E785 Hyperlipidemia, unspecified: Secondary | ICD-10-CM

## 2023-12-22 DIAGNOSIS — E162 Hypoglycemia, unspecified: Secondary | ICD-10-CM

## 2023-12-22 DIAGNOSIS — E118 Type 2 diabetes mellitus with unspecified complications: Secondary | ICD-10-CM | POA: Diagnosis not present

## 2023-12-22 NOTE — Patient Instructions (Signed)
 Lab Results  Component Value Date   HGBA1C 8.7 (H) 11/21/2023

## 2023-12-22 NOTE — Progress Notes (Signed)
 Name: Miranda Pollard   MRN: [id number]    DOB: [birth year]   Date:12/22/2023       Progress Note  Chief Complaint  Patient presents with   Medical Management of Chronic Issues    1 month follow-up   Diabetes   Hyperlipidemia     Subjective:   Miranda Pollard is a 73 y.o. female, presents to clinic for routine follow up on chronic conditions  And f/up on recent labs A1c 8.7 Mild CBC abnormality discussed today  Discussed the use of AI scribe software for clinical note transcription with the patient, who gave verbal consent to proceed.  History of Present Illness Miranda Pollard is a 73 year old female with type 2 diabetes who presents with concerns about blood sugar management and medication side effects.  Glycemic variability and hypoglycemia - Fluctuating blood glucose levels with occasional hypoglycemic episodes, lowest recorded at 47 mg/dL - Hypoglycemic episodes occur both during the day and at night - Uses a continuous glucose monitor and confirms low readings with finger stick testing - Carries glucose tablets for management of hypoglycemia - Current diabetes regimen includes glimepiride, two tablets in the morning and one at night  Dietary challenges in diabetes management - Difficulty maintaining a low-carbohydrate diet due to living environment - Meals frequently high in carbohydrates despite efforts to avoid them - Has communicated dietary needs to the chef, as many residents are diabetic or borderline diabetic - Limited control over meal preparation contributes to challenges in glycemic control  Laboratory abnormalities - Recent CBC revealed RBC and hematocrit levels outside normal range, consistent with previous results - Recent hemoglobin level within normal limits  Sleep apnea - History of sleep apnea  Foot and nail care - Receives podiatric care for difficulty with nail care - Presence of a nail with fungal infection - No current issues with  nail cutting     Current Outpatient Medications:    acetaminophen  (TYLENOL ) 650 MG CR tablet, Take by mouth as needed. , Disp: , Rfl:    albuterol  (PROVENTIL  HFA;VENTOLIN  HFA) 108 (90 Base) MCG/ACT inhaler, Inhale 2 puffs into the lungs every 4 (four) hours as needed for wheezing or shortness of breath., Disp: 1 Inhaler, Rfl: 0   aspirin  EC 81 MG tablet, Take 81 mg by mouth daily., Disp: , Rfl:    atorvastatin  (LIPITOR) 10 MG tablet, Take 1 tablet (10 mg total) by mouth at bedtime., Disp: 90 tablet, Rfl: 1   bevacizumab (AVASTIN) 1.25 mg/0.1 mL SOLN, Every 13-14 wks right eye., Disp: , Rfl:    cetirizine (ZYRTEC) 10 MG tablet, Take 10 mg by mouth daily., Disp: , Rfl:    Cyanocobalamin (VITAMIN B 12 PO), Take by mouth daily., Disp: , Rfl:    fluticasone  (FLONASE ) 50 MCG/ACT nasal spray, Place 2 sprays into both nostrils daily., Disp: 16 g, Rfl: 0   glimepiride (AMARYL) 1 MG tablet, Take 1 mg by mouth 2 (two) times daily., Disp: , Rfl:    HUMALOG KWIKPEN 100 UNIT/ML KwikPen, Inject 15 Units into the skin 3 (three) times daily before meals., Disp: , Rfl:    insulin  degludec (TRESIBA FLEXTOUCH) 100 UNIT/ML FlexTouch Pen, INJECT 20 UNITS UNDER THE SKIN ONCE A DAY. TITRATE TO A MAX DOSE OF 50 UNITS DAILY, Disp: , Rfl:    loperamide (IMODIUM A-D) 2 MG tablet, Take 2 mg by mouth as needed for diarrhea or loose stools., Disp: , Rfl:    losartan  (COZAAR ) 50 MG tablet, Take  1 tablet (50 mg total) by mouth daily., Disp: 90 tablet, Rfl: 0   metFORMIN (GLUMETZA) 1000 MG (MOD) 24 hr tablet, Take 1,000 mg by mouth 2 (two) times daily with a meal. , Disp: , Rfl:    Multiple Vitamins-Minerals (OCUVITE PO), Take 1 tablet by mouth daily., Disp: , Rfl:    omeprazole  (PRILOSEC) 20 MG capsule, Take 1 capsule (20 mg total) by mouth daily., Disp: 90 capsule, Rfl: 1   OMEPRAZOLE  PO, Take 20 mg by mouth daily., Disp: , Rfl:    pioglitazone (ACTOS) 15 MG tablet, Take 15 mg by mouth daily., Disp: , Rfl:    sertraline   (ZOLOFT ) 100 MG tablet, Take 1 tablet (100 mg total) by mouth daily., Disp: 90 tablet, Rfl: 3  Patient Active Problem List   Diagnosis Date Noted   Gastroesophageal reflux disease 11/21/2023   Nephrolithiasis 11/21/2023   Urinary incontinence 11/21/2023   Morbid obesity (HCC) 01/02/2018   Hyperlipidemia 01/02/2018   Type 2 diabetes mellitus with complication, without long-term current use of insulin  (HCC) 01/02/2018   Adjustment disorder 12/31/2017   Family history of breast cancer 09/07/2016   Hypertension associated with diabetes (HCC) 11/21/2013   Sleep apnea 11/01/2013   Hyperlipidemia due to type 2 diabetes mellitus (HCC) 11/01/2013    Past Surgical History:  Procedure Laterality Date   CARDIAC CATHETERIZATION  05/2007   No stents [facility]   CARPAL TUNNEL RELEASE  1980's   CATARACT EXTRACTION W/PHACO Left 08/27/2018   Procedure: CATARACT EXTRACTION PHACO AND INTRAOCULAR LENS PLACEMENT (IOC)  LEFT DIABETIC;  Surgeon: Myrna Adine Anes, MD;  Location: [facility];  Service: Ophthalmology;  Laterality: Left;  Diabetic - oral meds sleep apnea   CATARACT EXTRACTION W/PHACO Right 09/24/2018   Procedure: CATARACT EXTRACTION PHACO AND INTRAOCULAR LENS PLACEMENT (IOC)  RIGHT DIABETIC;  Surgeon: Myrna Adine Anes, MD;  Location: [facility];  Service: Ophthalmology;  Laterality: Right;  diabetic - insulin  and oral meds   CHOLECYSTECTOMY, LAPAROSCOPIC  2002   COLONOSCOPY  07/2014   ESOPHAGOGASTRODUODENOSCOPY ENDOSCOPY  07/2014   HYSTEROSCOPY  2004   cystic hyperplasia   HYSTEROSCOPY  2008   postemopausa bleeding weakly proliferative endometrium on pathology   LAPAROSCOPY  1993   Laparoscopy and laparotomy for ovarian cysts and fibroid    Family History  Problem Relation Age of Onset   Hypertension Mother    Melanoma Mother 28   Heart Problems Mother    Asthma Father    Congestive Heart Failure Father    COPD Father    Diabetes Father    Emphysema Father     Diabetes Sister    Breast cancer Sister 38   Atrial fibrillation Sister    Hypertension Brother    Sleep apnea Brother    Heart attack Maternal Uncle    Colon cancer Maternal Grandmother    Heart attack Maternal Grandfather    Breast cancer Cousin 60       2 pat cousins    Social History   Tobacco Use   Smoking status: Never   Smokeless tobacco: Never  Vaping Use   Vaping status: Never Used  Substance Use Topics   Alcohol use: No   Drug use: No     Allergies  Allergen Reactions   Blue Dyes (Parenteral) Anaphylaxis   Ciprofloxacin Nausea And Vomiting   Hydrocodone Palpitations    Health Maintenance  Topic Date Due   Medicare Annual Wellness (AWV)  Never done   FOOT EXAM  Never  done   COVID-19 Vaccine (7 - Mixed Product risk 2024-25 season) 01/06/2024 (Originally 12/11/2023)   Influenza Vaccine  07/09/2024 (Originally 11/10/2023)   Zoster Vaccines- Shingrix (2 of 2) 01/08/2024   HEMOGLOBIN A1C  05/23/2024   Colonoscopy  07/24/2024   OPHTHALMOLOGY EXAM  10/16/2024   Diabetic kidney evaluation - eGFR measurement  11/20/2024   Diabetic kidney evaluation - Urine ACR  11/20/2024   Mammogram  12/27/2024   DTaP/Tdap/Td (3 - Td or Tdap) 01/21/2032   Pneumococcal Vaccine: 50+ Years  Completed   DEXA SCAN  Completed   Hepatitis C Screening  Completed   HPV VACCINES  Aged Out   Meningococcal B Vaccine  Aged Out    Chart Review Today: I personally reviewed active problem list, medication list, allergies, family history, social history, health maintenance, notes from last encounter, lab results, imaging with the patient/caregiver today.   Review of Systems  Constitutional: Negative.   HENT: Negative.    Eyes: Negative.   Respiratory: Negative.    Cardiovascular: Negative.   Gastrointestinal: Negative.   Endocrine: Negative.   Genitourinary: Negative.   Musculoskeletal: Negative.   Skin: Negative.   Allergic/Immunologic: Negative.   Neurological: Negative.    Hematological: Negative.   Psychiatric/Behavioral: Negative.    All other systems reviewed and are negative.    Objective:   Vitals:   12/22/23 0923  BP: 120/78  Pulse: 89  Resp: 16  SpO2: 95%  Weight: 250 lb (113.4 kg)  Height: 5' 4 (1.626 m)    Body mass index is 42.91 kg/m.  Physical Exam Vitals and nursing note reviewed.  Constitutional:      General: She is not in acute distress.    Appearance: Normal appearance. She is well-developed. She is obese. She is not ill-appearing, toxic-appearing or diaphoretic.  HENT:     Head: Normocephalic and atraumatic.     Right Ear: External ear normal.     Left Ear: External ear normal.     Nose: Nose normal.  Eyes:     General: No scleral icterus.       Right eye: No discharge.        Left eye: No discharge.     Conjunctiva/sclera: Conjunctivae normal.  Neck:     Trachea: No tracheal deviation.  Cardiovascular:     Rate and Rhythm: Normal rate.  Pulmonary:     Effort: Pulmonary effort is normal. No respiratory distress.     Breath sounds: No stridor.  Skin:    General: Skin is warm and dry.     Findings: No rash.  Neurological:     Mental Status: She is alert.     Motor: No abnormal muscle tone.     Coordination: Coordination normal.     Gait: Gait normal.  Psychiatric:        Mood and Affect: Mood normal.        Behavior: Behavior normal.     Diabetic Foot Exam - Simple   Simple Foot Form Diabetic Foot exam was performed with the following findings: Yes 12/22/2023  9:54 AM  Visual Inspection No deformities, no ulcerations, no other skin breakdown bilaterally: Yes Sensation Testing Intact to touch and monofilament testing bilaterally: Yes Pulse Check Posterior Tibialis and Dorsalis pulse intact bilaterally: Yes Comments Fungal disease in some nails right great toe        Results for orders placed or performed in visit on 11/21/23  CBC with Differential/Platelet   Collection Time: 11/21/23 10:38 AM  Result Value Ref Range   WBC 8.1 3.8 - 10.8 Thousand/uL   RBC 5.11 (H) 3.80 - 5.10 Million/uL   Hemoglobin 14.5 11.7 - 15.5 g/dL   HCT 54.0 (H) 64.9 - 54.9 %   MCV 89.8 80.0 - 100.0 fL   MCH 28.4 27.0 - 33.0 pg   MCHC 31.6 (L) 32.0 - 36.0 g/dL   RDW 86.6 88.9 - 84.9 %   Platelets 299 140 - 400 Thousand/uL   MPV 9.7 7.5 - 12.5 fL   Neutro Abs 3,799 1,500 - 7,800 cells/uL   Absolute Lymphocytes 3,248 850 - 3,900 cells/uL   Absolute Monocytes 794 200 - 950 cells/uL   Eosinophils Absolute 186 15 - 500 cells/uL   Basophils Absolute 73 0 - 200 cells/uL   Neutrophils Relative % 46.9 %   Total Lymphocyte 40.1 %   Monocytes Relative 9.8 %   Eosinophils Relative 2.3 %   Basophils Relative 0.9 %  Comprehensive Metabolic Panel (CMET)   Collection Time: 11/21/23 10:38 AM  Result Value Ref Range   Glucose, Bld 158 (H) 65 - 99 mg/dL   BUN 14 7 - 25 mg/dL   Creat 9.39 9.39 - 8.99 mg/dL   eGFR 97 > OR = 60 fO/fpw/8.26f7   BUN/Creatinine Ratio SEE NOTE: 6 - 22 (calc)   Sodium 138 135 - 146 mmol/L   Potassium 5.0 3.5 - 5.3 mmol/L   Chloride 99 98 - 110 mmol/L   CO2 31 20 - 32 mmol/L   Calcium  9.6 8.6 - 10.4 mg/dL   Total Protein 7.0 6.1 - 8.1 g/dL   Albumin 4.0 3.6 - 5.1 g/dL   Globulin 3.0 1.9 - 3.7 g/dL (calc)   AG Ratio 1.3 1.0 - 2.5 (calc)   Total Bilirubin 0.5 0.2 - 1.2 mg/dL   Alkaline phosphatase (APISO) 53 37 - 153 U/L   AST 22 10 - 35 U/L   ALT 24 6 - 29 U/L  HgB A1c   Collection Time: 11/21/23 10:38 AM  Result Value Ref Range   Hgb A1c MFr Bld 8.7 (H) <5.7 %   Mean Plasma Glucose 203 mg/dL   eAG (mmol/L) 88.7 mmol/L  Urine Microalbumin w/creat. ratio   Collection Time: 11/21/23 10:38 AM  Result Value Ref Range   Creatinine, Urine 25 20 - 275 mg/dL   Microalb, Ur <9.7 mg/dL   Microalb Creat Ratio NOTE <30 mg/g creat  Lipid Profile   Collection Time: 11/21/23 10:38 AM  Result Value Ref Range   Cholesterol 163 <200 mg/dL   HDL 53 > OR = 50 mg/dL   Triglycerides 79  <849 mg/dL   LDL Cholesterol (Calc) 93 mg/dL (calc)   Total CHOL/HDL Ratio 3.1 <5.0 (calc)   Non-HDL Cholesterol (Calc) 110 <130 mg/dL (calc)  Hepatitis C Antibody   Collection Time: 11/21/23 10:38 AM  Result Value Ref Range   Hepatitis C Ab NON-REACTIVE NON-REACTIVE      Assessment & Plan:    Assessment & Plan Type 2 diabetes mellitus with hypoglycemia - Experiencing hypoglycemic episodes with blood glucose levels as low as 47 mg/dL, occurring both day and night. Currently on multiple diabetes medications including insulin  and glimepiride. Concern about medication-induced hypoglycemia. Limited control over meal composition at her residence. - temporarily Reduce glimepiride dosage to one in the morning and skip the night dose. - Contact endocrinologist to discuss medication adjustments - Advise to monitor blood glucose levels closely and use glucose tablets or juice if levels drop below 65 mg/dL -  w/ 15 min recheck - education provided - Ensure endocrinologist has access to continuous glucose monitor data.  Hypertension associated with diabetes Blood pressure is well-controlled on current medication regimen. - Continue current blood pressure medication regimen. - Schedule follow-up in six months for blood pressure monitoring.  Mixed hyperlipidemia Cholesterol levels are well-controlled on current medication regimen. - Continue current cholesterol medication regimen. - Schedule follow-up in six months for cholesterol monitoring.  Onychomycosis Presence of nail with fungal disease, longstanding condition.  Recording duration: 15 minutes  1. Type 2 diabetes mellitus with complication, without long-term current use of insulin  (HCC) (Primary) See above  2. Hyperlipidemia due to type 2 diabetes mellitus [facility]) Lab Results  Component Value Date   CHOL 163 11/21/2023   HDL 53 11/21/2023   LDLCALC 93 11/21/2023   TRIG 79 11/21/2023   CHOLHDL 3.1 11/21/2023   3. Hypertension  associated with diabetes (HCC) See above BP Readings from Last 3 Encounters:  12/22/23 120/78  11/21/23 122/72  11/17/23 134/72     4. Gastroesophageal reflux disease, unspecified whether esophagitis present Sx stable  5. Hypoglycemia See above    Return in about 5 months (around 05/23/2024) for HTN, HLD refills.   Michelene Cower, PA-C 12/22/23 9:46 AM

## 2023-12-29 ENCOUNTER — Telehealth: Payer: Self-pay

## 2023-12-29 NOTE — Progress Notes (Signed)
   12/29/2023  Nena VEAR Glatter  DOB: 1950/09/11 MRN: [id number]  Attempted to contact patient for medication management/review. Left HIPAA compliant message for patient to return my call at their convenience.   First attempt for patient outreach. Will follow up with patient in 3-5 business days.  Franz Svec E. Marsh, PharmD Clinical Pharmacist [facility] [phone]

## 2024-01-16 DIAGNOSIS — I152 Hypertension secondary to endocrine disorders: Secondary | ICD-10-CM | POA: Diagnosis not present

## 2024-01-16 DIAGNOSIS — E1142 Type 2 diabetes mellitus with diabetic polyneuropathy: Secondary | ICD-10-CM | POA: Diagnosis not present

## 2024-01-16 DIAGNOSIS — E1169 Type 2 diabetes mellitus with other specified complication: Secondary | ICD-10-CM | POA: Diagnosis not present

## 2024-01-16 DIAGNOSIS — E1159 Type 2 diabetes mellitus with other circulatory complications: Secondary | ICD-10-CM | POA: Diagnosis not present

## 2024-01-16 DIAGNOSIS — E785 Hyperlipidemia, unspecified: Secondary | ICD-10-CM | POA: Diagnosis not present

## 2024-01-16 DIAGNOSIS — Z794 Long term (current) use of insulin: Secondary | ICD-10-CM | POA: Diagnosis not present

## 2024-01-30 ENCOUNTER — Telehealth: Payer: Self-pay

## 2024-01-30 NOTE — Telephone Encounter (Signed)
 Called [place] back. She stated patient is needing new CPAP supplies. I informed her we will need an appointment plus a copy of the patients sleep study. Without that we cannot order the supplies to send to [facility]. I informed her they require office visit notes and copy of sleep study stating the need for CPAP. She stated she will call the patient and inform her and ask her to schedule an appointment.

## 2024-01-30 NOTE — Telephone Encounter (Signed)
 Mariana from [facility] would like a call back regarding C Pap supplies for this pt. [phone]

## 2024-02-05 NOTE — Telephone Encounter (Unsigned)
 Copied from CRM [id number]. Topic: General - Other >> Feb 05, 2024  3:58 PM Everette C wrote: Reason for CRM: Mariana with [employer] has made an additional phone call to the practice to discuss and coordinate an appointment for the patient so that they can be seen for a prescription for CPAP supplies. Please contact Mariana at [phone] if/when possible

## 2024-02-05 NOTE — Telephone Encounter (Signed)
Patient has appointment made  

## 2024-02-13 ENCOUNTER — Ambulatory Visit (INDEPENDENT_AMBULATORY_CARE_PROVIDER_SITE_OTHER)

## 2024-02-13 DIAGNOSIS — E118 Type 2 diabetes mellitus with unspecified complications: Secondary | ICD-10-CM

## 2024-02-13 DIAGNOSIS — Z794 Long term (current) use of insulin: Secondary | ICD-10-CM | POA: Diagnosis not present

## 2024-02-13 MED ORDER — RYBELSUS 7 MG PO TABS: 7.0000 mg | ORAL_TABLET | Freq: Every day | ORAL | 1 refills | Status: DC

## 2024-02-13 MED ORDER — FREESTYLE LIBRE 3 PLUS SENSOR MISC: Status: AC

## 2024-02-13 MED ORDER — HUMALOG KWIKPEN 100 UNIT/ML ~~LOC~~ SOPN: 14.0000 [IU] | PEN_INJECTOR | Freq: Three times a day (TID) | SUBCUTANEOUS | 1 refills | Status: AC

## 2024-02-13 NOTE — Progress Notes (Signed)
 S:     Reason for visit: ?  Miranda Pollard is a 73 y.o. female with a history of diabetes (type 2), who presents today for an initial diabetes Face to Face pharmacotherapy visit.? Pertinent PMH also includes HTN, obesity, GERD, HLD, urinary incontinence.  Care Team: Primary Care Provider: Tapia, Leisa, PA-C  At last visit with endocrinology on  01/16/24, patient was instructed to increase Tresiba to 62 units daily. Patient was also instructed to discontinue glimepiride at that time. Discussed trialing an alternate GLP1 agent, but patient declined at that time.   Current diabetes medications include: Humalog 15 units TID (taking 16 units), Tresiba 62 units daily, pioglitazone 15 mg daily, metformin 1000 mg BID Previous diabetes medications include: Victoza & Ozempic (stomach pain) Current hypertension medications include: losartan  50 mg daily Current hyperlipidemia medications include: atorvastatin  10 mg daily  Patient reports adherence to taking all medications as prescribed.   Have you been experiencing any side effects to the medications prescribed? no Do you have any problems obtaining medications due to transportation or finances? no Insurance coverage: [facility] Medicare  Patient reports infrequent post-prandial hypoglycemic events.   DM Prevention:  Statin: Taking; moderate intensity.?  ACE/ARB: yes; losartan  History of chronic kidney disease? no Last urinary albumin/creatinine ratio:  Lab Results  Component Value Date   MICRALBCREAT NOTE 11/21/2023   Last eye exam:  Lab Results  Component Value Date   HMDIABEYEEXA No Retinopathy 10/17/2023   Lab Results  Component Value Date   HMDIABEYEEXA No Retinopathy 10/17/2023   Last foot exam: 12/22/2023 Tobacco Use:  Tobacco Use: Low Risk  (01/16/2024)   Received from [facility]   Patient History    Smoking Tobacco Use: Never    Smokeless Tobacco Use: Never    Passive Exposure: Not on file   O:   LibreView Report    Vitals:  Wt Readings from Last 3 Encounters:  12/22/23 250 lb (113.4 kg)  11/21/23 248 lb (112.5 kg)  11/17/23 249 lb (112.9 kg)   BP Readings from Last 3 Encounters:  12/22/23 120/78  11/21/23 122/72  11/17/23 134/72   Pulse Readings from Last 3 Encounters:  12/22/23 89  11/21/23 85  11/17/23 72     Labs:?  Lab Results  Component Value Date   HGBA1C 8.7 (H) 11/21/2023   HGBA1C 9.8 09/07/2023   GLUCOSE 158 (H) 11/21/2023   MICRALBCREAT NOTE 11/21/2023   CREATININE 0.60 11/21/2023   CREATININE 0.92 01/20/2022   CREATININE 0.54 10/31/2017    Lab Results  Component Value Date   CHOL 163 11/21/2023   LDLCALC 93 11/21/2023   HDL 53 11/21/2023   TRIG 79 11/21/2023   ALT 24 11/21/2023   ALT 30 01/20/2022   AST 22 11/21/2023   AST 37 01/20/2022      Chemistry      Component Value Date/Time   NA 138 11/21/2023 1038   NA 139 12/30/2013 1415   K 5.0 11/21/2023 1038   K 3.7 12/30/2013 1415   CL 99 11/21/2023 1038   CL 99 12/30/2013 1415   CO2 31 11/21/2023 1038   CO2 31 12/30/2013 1415   BUN 14 11/21/2023 1038   BUN 9 12/30/2013 1415   CREATININE 0.60 11/21/2023 1038      Component Value Date/Time   CALCIUM  9.6 11/21/2023 1038   CALCIUM  9.6 12/30/2013 1415   ALKPHOS 74 01/20/2022 1839   ALKPHOS 55 12/30/2013 1415   AST 22 11/21/2023 1038  AST 43 (H) 12/30/2013 1415   ALT 24 11/21/2023 1038   ALT 58 12/30/2013 1415   BILITOT 0.5 11/21/2023 1038   BILITOT 0.3 12/30/2013 1415       The 10-year ASCVD risk score (Arnett DK, et al., 2019) is: 22.5%  Lab Results  Component Value Date   MICRALBCREAT NOTE 11/21/2023    A/P: Diabetes currently uncontrolled with a most recent A1c of 8.7% on 11/21/23, which is down from 9.8% on 09/07/23. Patient is able to verbalize appropriate hypoglycemia management plan. Medication adherence appears optimal. CGM shows a GMI of 8.3% with some infrequent hypoglycemic events midday, likely after bolus  insulin  administration. Patient reported some stomach pain with Victoza and Ozempic. Declined trialing an alternate GLP1 with Endo last month, but is agreeable at this time. Will decrease dose of short-acting insulin  given incidences of hypoglycemia. Patient remains taking glimepiride - will discontinue according to Endo instructions. -Continued basal insulin  Tresiba (insulin  degludec)  62 units daily.  -Decreased dose of rapid insulin  Humalog (insulin  lispro) from 16 to 14 units TID.  -Gave samples of GLP-1 Rybelsus (semaglutide) 3 mg daily -Discontinued glimepiride as previously instructed by endocrinology -Continued metformin 1000 mg BID. -Continued pioglitazone 15 mg daily  -Patient educated on purpose, proper use, and potential adverse effects of Rybelsus.  -Extensively discussed pathophysiology of diabetes, recommended lifestyle interventions, dietary effects on blood sugar control.  -Counseled on s/sx of and management of hypoglycemia.  -Next A1c anticipated 02/2024.   ASCVD risk - primary prevention in patient with diabetes. Last LDL is 93 mg/dL, not at goal of <29 mg/dL. May discuss increasing statin dose at follow up.  -Continued atorvastatin  10 mg daily.   Patient verbalized understanding of treatment plan. Total time patient counseling 30 minutes.  Follow-up:  Pharmacist on 03/12/24 PCP clinic visit on 02/14/24  Peyton CHARLENA Ferries, PharmD, CPP Clinical Pharmacist [facility] [phone]

## 2024-02-14 ENCOUNTER — Encounter: Payer: Self-pay | Admitting: Nurse Practitioner

## 2024-02-14 ENCOUNTER — Ambulatory Visit: Admitting: Family Medicine

## 2024-02-14 ENCOUNTER — Ambulatory Visit: Admitting: Nurse Practitioner

## 2024-02-14 VITALS — BP 130/78 | HR 95 | Temp 98.2°F | Ht 64.0 in | Wt 249.0 lb

## 2024-02-14 DIAGNOSIS — J4521 Mild intermittent asthma with (acute) exacerbation: Secondary | ICD-10-CM

## 2024-02-14 MED ORDER — DEXAMETHASONE SOD PHOSPHATE PF 10 MG/ML IJ SOLN
10.0000 mg | Freq: Once | INTRAMUSCULAR | Status: AC
  Administered 2024-02-14: 10 mg via INTRAMUSCULAR

## 2024-02-14 MED ORDER — ALBUTEROL SULFATE HFA 108 (90 BASE) MCG/ACT IN AERS: 2.0000 | INHALATION_SPRAY | RESPIRATORY_TRACT | 3 refills | Status: AC | PRN

## 2024-02-14 NOTE — Progress Notes (Signed)
 BP 130/78   Pulse 95   Temp 98.2 F (36.8 C)   Ht 5' 4 (1.626 m)   Wt 249 lb (112.9 kg)   SpO2 96%   BMI 42.74 kg/m    Subjective:    Patient ID: Miranda Pollard, female    DOB: [birth year], 73 y.o.   MRN: [id number]  HPI: Miranda Pollard is a 73 y.o. female  Chief Complaint  Patient presents with   Obstructive Sleep Apnea    Pt would like to discuss her cpap machine.    Cough    Pt c/o cough x3 weeks.    Discussed the use of AI scribe software for clinical note transcription with the patient, who gave verbal consent to proceed.  History of Present Illness Miranda Pollard is a 72 year old female who presents for CPAP supply refills and evaluation of a persistent cough.  Persistent cough and upper respiratory symptoms - Persistent cough for three weeks, attributed to allergies (ragweed) - Cough severe enough to cause facial discoloration ('turn purple in the face') - Associated symptoms include sneezing, nasal congestion, and phlegm production - Throat described as scratchy and dry, without significant sore throat - Nocturnal cough present until last night, when she did not wake up from coughing - No current use of cough medication; previously used cough syrup suitable for hypertension and diabetes, but not taken in the last three days due to improving symptoms - Negative COVID-19 test  Asthma - History of asthma, previously asymptomatic until recent onset of cough - No current asthma medication usage  Cpap therapy and equipment issues -patient reports she uses her cpap machine every night 100% of the time - Ongoing issues with CPAP supply provider resulting in delays in receiving equipment - Multiple supply providers used in the past, including [employer] and [employer] - Currently dealing with [facility], which she finds unsatisfactory - Active communication with insurance company to resolve supply issues  Diabetes mellitus and medication regimen - Current  medications include Rybelsus, Humalog insulin  (14 units TID), Tresiba (62 units daily), Actos (15 mg daily), and metformin (1000 mg BID) - Last hemoglobin A1c was 8.7% on November 21, 2023  Hypertension and medication adjustment - Current antihypertensive medication is losartan  (50 mg daily) - Switched from lisinopril to losartan  in August due to chronic cough  Other chronic medications - Atorvastatin  (10 mg daily), sertraline  (100 mg daily), omeprazole  (20 mg daily)         11/21/2023    9:00 AM  Depression screen PHQ 2/9  Decreased Interest 0  Down, Depressed, Hopeless 0  PHQ - 2 Score 0  Altered sleeping 0  Tired, decreased energy 0  Change in appetite 0  Feeling bad or failure about yourself  0  Trouble concentrating 0  Moving slowly or fidgety/restless 0  Suicidal thoughts 0  PHQ-9 Score 0    Relevant past medical, surgical, family and social history reviewed and updated as indicated. Interim medical history since our last visit reviewed. Allergies and medications reviewed and updated.  Review of Systems  Ten systems reviewed and is negative except as mentioned in HPI      Objective:      BP 130/78   Pulse 95   Temp 98.2 F (36.8 C)   Ht 5' 4 (1.626 m)   Wt 249 lb (112.9 kg)   SpO2 96%   BMI 42.74 kg/m    Wt Readings from Last 3 Encounters:  02/14/24 249 lb (  112.9 kg)  12/22/23 250 lb (113.4 kg)  11/21/23 248 lb (112.5 kg)    Physical Exam GENERAL: Alert, cooperative, well developed, no acute distress. HEENT: Normocephalic, normal oropharynx, moist mucous membranes, throat scratchy and dry. CHEST: Wheezing present. CARDIOVASCULAR: Normal heart rate and rhythm, S1 and S2 normal without murmurs. ABDOMEN: Soft, non-tender, non-distended, without organomegaly, normal bowel sounds. EXTREMITIES: No cyanosis or edema. NEUROLOGICAL: Cranial nerves grossly intact, moves all extremities without gross motor or sensory deficit.  Results for orders placed or  performed in visit on 11/21/23  CBC with Differential/Platelet   Collection Time: 11/21/23 10:38 AM  Result Value Ref Range   WBC 8.1 3.8 - 10.8 Thousand/uL   RBC 5.11 (H) 3.80 - 5.10 Million/uL   Hemoglobin 14.5 11.7 - 15.5 g/dL   HCT 54.0 (H) 64.9 - 54.9 %   MCV 89.8 80.0 - 100.0 fL   MCH 28.4 27.0 - 33.0 pg   MCHC 31.6 (L) 32.0 - 36.0 g/dL   RDW 86.6 88.9 - 84.9 %   Platelets 299 140 - 400 Thousand/uL   MPV 9.7 7.5 - 12.5 fL   Neutro Abs 3,799 1,500 - 7,800 cells/uL   Absolute Lymphocytes 3,248 850 - 3,900 cells/uL   Absolute Monocytes 794 200 - 950 cells/uL   Eosinophils Absolute 186 15 - 500 cells/uL   Basophils Absolute 73 0 - 200 cells/uL   Neutrophils Relative % 46.9 %   Total Lymphocyte 40.1 %   Monocytes Relative 9.8 %   Eosinophils Relative 2.3 %   Basophils Relative 0.9 %  Comprehensive Metabolic Panel (CMET)   Collection Time: 11/21/23 10:38 AM  Result Value Ref Range   Glucose, Bld 158 (H) 65 - 99 mg/dL   BUN 14 7 - 25 mg/dL   Creat 9.39 9.39 - 8.99 mg/dL   eGFR 97 > OR = 60 fO/fpw/8.26f7   BUN/Creatinine Ratio SEE NOTE: 6 - 22 (calc)   Sodium 138 135 - 146 mmol/L   Potassium 5.0 3.5 - 5.3 mmol/L   Chloride 99 98 - 110 mmol/L   CO2 31 20 - 32 mmol/L   Calcium  9.6 8.6 - 10.4 mg/dL   Total Protein 7.0 6.1 - 8.1 g/dL   Albumin 4.0 3.6 - 5.1 g/dL   Globulin 3.0 1.9 - 3.7 g/dL (calc)   AG Ratio 1.3 1.0 - 2.5 (calc)   Total Bilirubin 0.5 0.2 - 1.2 mg/dL   Alkaline phosphatase (APISO) 53 37 - 153 U/L   AST 22 10 - 35 U/L   ALT 24 6 - 29 U/L  HgB A1c   Collection Time: 11/21/23 10:38 AM  Result Value Ref Range   Hgb A1c MFr Bld 8.7 (H) <5.7 %   Mean Plasma Glucose 203 mg/dL   eAG (mmol/L) 88.7 mmol/L  Urine Microalbumin w/creat. ratio   Collection Time: 11/21/23 10:38 AM  Result Value Ref Range   Creatinine, Urine 25 20 - 275 mg/dL   Microalb, Ur <9.7 mg/dL   Microalb Creat Ratio NOTE <30 mg/g creat  Lipid Profile   Collection Time: 11/21/23 10:38 AM   Result Value Ref Range   Cholesterol 163 <200 mg/dL   HDL 53 > OR = 50 mg/dL   Triglycerides 79 <849 mg/dL   LDL Cholesterol (Calc) 93 mg/dL (calc)   Total CHOL/HDL Ratio 3.1 <5.0 (calc)   Non-HDL Cholesterol (Calc) 110 <130 mg/dL (calc)  Hepatitis C Antibody   Collection Time: 11/21/23 10:38 AM  Result Value Ref Range   Hepatitis  C Ab NON-REACTIVE NON-REACTIVE          Assessment & Plan:   Problem List Items Addressed This Visit       Respiratory   Mild intermittent asthma with acute exacerbation - Primary   Relevant Medications   dexamethasone (DECADRON) injection 10 mg   albuterol  (VENTOLIN  HFA) 108 (90 Base) MCG/ACT inhaler     Assessment and Plan Assessment & Plan Asthma with acute exacerbation Acute exacerbation likely triggered by ragweed allergies, presenting with wheezing, cough, and congestion for three weeks. No current medication for cough. Previous inhaler expired. Tolerates steroids in shot or inhaler form, not orally. - Administered steroid shot to alleviate symptoms - Refilled albuterol  inhaler for use as needed  Obstructive sleep apnea Managed with CPAP. Reports 100% adherence to CPAP therapy. Issues with CPAP supply provider causing delays in receiving supplies. Previous providers included [facility] and [facility], with dissatisfaction due to poor service. - Ordered new CPAP supplies - Coordinated with insurance to resolve supply issues        Follow up plan: Appointment scheduled

## 2024-03-11 ENCOUNTER — Ambulatory Visit: Admitting: Urology

## 2024-03-11 ENCOUNTER — Other Ambulatory Visit: Payer: Self-pay

## 2024-03-11 VITALS — BP 149/72 | HR 100 | Ht 64.0 in | Wt 249.0 lb

## 2024-03-11 DIAGNOSIS — N3946 Mixed incontinence: Secondary | ICD-10-CM

## 2024-03-11 DIAGNOSIS — Z87442 Personal history of urinary calculi: Secondary | ICD-10-CM | POA: Diagnosis not present

## 2024-03-11 DIAGNOSIS — R32 Unspecified urinary incontinence: Secondary | ICD-10-CM

## 2024-03-11 NOTE — Progress Notes (Signed)
 03/11/2024 11:05 AM   Miranda Pollard 1950/10/09 [id number]  Referring provider: Leavy Mole, PA-C No address on file  No chief complaint on file.   HPI: I was consulted to assess the patient as urinary incontinence.  She has urge incontinence.  She leaks with coughing sneezing bending lifting.  Both are significant.  She soaks 4-6 pads a day.  She has high-volume bedwetting.  She voids every hour cannot hold it for 2 hours.  No nocturia.  Flow is reasonable.  Symptoms worsened since May  She is an insulin -dependent diabetic.  No hysterectomy  She has a history of kidney stones  No history of bladder surgery or bladder infections.  No treatment     PMH: Past Medical History:  Diagnosis Date   Apnea    Arthritis    hands   Asthma    Calculus of kidney    Chest pain    a. 2009 MV: Abnl w/ ant wall defect; b. 04/2007 Cath: nl cors. EF 62%; c. 12/2017 Cardiac CT: Ca2+ = 0.   Diabetes mellitus without complication (HCC)    Diastolic dysfunction    a. 02/2022 Echo: EF 60-65%, no rwma, GrI DD, nl RV fxn, mild AI.   Family history of breast cancer 03/04/2015   MyRisk NEG   Genital herpes    GERD (gastroesophageal reflux disease)    History of Papanicolaou smear of cervix 05/02/2016   NIL/NEG   Hypertension    Macular degeneration (senile) of retina    Motion sickness    Sleep apnea    CPAP    Surgical History: Past Surgical History:  Procedure Laterality Date   CARDIAC CATHETERIZATION  05/2007   No stents [facility]   CARPAL TUNNEL RELEASE  1980's   CATARACT EXTRACTION W/PHACO Left 08/27/2018   Procedure: CATARACT EXTRACTION PHACO AND INTRAOCULAR LENS PLACEMENT (IOC)  LEFT DIABETIC;  Surgeon: Myrna Adine Anes, MD;  Location: [facility];  Service: Ophthalmology;  Laterality: Left;  Diabetic - oral meds sleep apnea   CATARACT EXTRACTION W/PHACO Right 09/24/2018   Procedure: CATARACT EXTRACTION PHACO AND INTRAOCULAR LENS PLACEMENT (IOC)  RIGHT DIABETIC;   Surgeon: Myrna Adine Anes, MD;  Location: [facility];  Service: Ophthalmology;  Laterality: Right;  diabetic - insulin  and oral meds   CHOLECYSTECTOMY, LAPAROSCOPIC  2002   COLONOSCOPY  07/2014   ESOPHAGOGASTRODUODENOSCOPY ENDOSCOPY  07/2014   HYSTEROSCOPY  2004   cystic hyperplasia   HYSTEROSCOPY  2008   postemopausa bleeding weakly proliferative endometrium on pathology   LAPAROSCOPY  1993   Laparoscopy and laparotomy for ovarian cysts and fibroid    Home Medications:  Allergies as of 03/11/2024       Reactions   Blue Dyes (parenteral) Anaphylaxis   Ciprofloxacin Nausea And Vomiting   Hydrocodone Palpitations        Medication List        Accurate as of March 11, 2024 11:05 AM. If you have any questions, ask your nurse or doctor.          acetaminophen  650 MG CR tablet Commonly known as: TYLENOL  Take by mouth as needed.   albuterol  108 (90 Base) MCG/ACT inhaler Commonly known as: VENTOLIN  HFA Inhale 2 puffs into the lungs every 4 (four) hours as needed for wheezing or shortness of breath.   aspirin  EC 81 MG tablet Take 81 mg by mouth daily.   atorvastatin  10 MG tablet Commonly known as: LIPITOR Take 1 tablet (10 mg total) by mouth  at bedtime.   bevacizumab 1.25 mg/0.1 mL Soln Commonly known as: AVASTIN Every 13-14 wks right eye.   fluticasone  50 MCG/ACT nasal spray Commonly known as: FLONASE  Place 2 sprays into both nostrils daily.   FreeStyle Libre 3 Plus Sensor Misc Change sensor every 15 days.   HumaLOG  KwikPen 100 UNIT/ML KwikPen Generic drug: insulin  lispro Inject 14 Units into the skin 3 (three) times daily before meals.   loperamide 2 MG tablet Commonly known as: IMODIUM A-D Take 2 mg by mouth as needed for diarrhea or loose stools.   losartan  50 MG tablet Commonly known as: COZAAR  Take 1 tablet (50 mg total) by mouth daily.   metFORMIN 1000 MG (MOD) 24 hr tablet Commonly known as: GLUMETZA Take 1,000 mg by mouth 2 (two)  times daily with a meal.   OCUVITE PO Take 1 tablet by mouth daily.   omeprazole  20 MG capsule Commonly known as: PRILOSEC Take 1 capsule (20 mg total) by mouth daily.   pioglitazone 15 MG tablet Commonly known as: ACTOS Take 15 mg by mouth daily.   Rybelsus  7 MG Tabs Generic drug: Semaglutide  Take 1 tablet (7 mg total) by mouth daily.   sertraline  100 MG tablet Commonly known as: ZOLOFT  Take 1 tablet (100 mg total) by mouth daily.   Tresiba FlexTouch 100 UNIT/ML FlexTouch Pen Generic drug: insulin  degludec Inject 62 Units into the skin daily.   VITAMIN B 12 PO Take by mouth daily.        Allergies:  Allergies  Allergen Reactions   Blue Dyes (Parenteral) Anaphylaxis   Ciprofloxacin Nausea And Vomiting   Hydrocodone Palpitations    Family History: Family History  Problem Relation Age of Onset   Hypertension Mother    Melanoma Mother 81   Heart Problems Mother    Asthma Father    Congestive Heart Failure Father    COPD Father    Diabetes Father    Emphysema Father    Diabetes Sister    Breast cancer Sister 67   Atrial fibrillation Sister    Hypertension Brother    Sleep apnea Brother    Heart attack Maternal Uncle    Colon cancer Maternal Grandmother    Heart attack Maternal Grandfather    Breast cancer Cousin 60       2 pat cousins    Social History:  reports that she has never smoked. She has never used smokeless tobacco. She reports that she does not drink alcohol and does not use drugs.  ROS:                                        Physical Exam: There were no vitals taken for this visit.  Constitutional:  Alert and oriented, No acute distress. HEENT: [place] AT, moist mucus membranes.  Trachea midline, no masses. Cardiovascular: No clubbing, cyanosis, or edema. Respiratory: Normal respiratory effort, no increased work of breathing. GI: Abdomen is soft, nontender, nondistended, no abdominal masses GU: Exam a little bit  limited due to body habitus and speculum.  Well supported bladder neck and no stress incontinence with moderate cough. Skin: No rashes, bruises or suspicious lesions. Lymph: No cervical or inguinal adenopathy. Neurologic: Grossly intact, no focal deficits, moving all 4 extremities. Psychiatric: Normal mood and affect.  Laboratory Data: Lab Results  Component Value Date   WBC 8.1 11/21/2023   HGB 14.5 11/21/2023  HCT 45.9 (H) 11/21/2023   MCV 89.8 11/21/2023   PLT 299 11/21/2023    Lab Results  Component Value Date   CREATININE 0.60 11/21/2023    No results found for: PSA  No results found for: TESTOSTERONE  Lab Results  Component Value Date   HGBA1C 8.7 (H) 11/21/2023    Urinalysis    Component Value Date/Time   COLORURINE STRAW (A) 01/20/2022 1839   APPEARANCEUR CLEAR (A) 01/20/2022 1839   APPEARANCEUR CLEAR 12/30/2013 1414   LABSPEC 1.023 01/20/2022 1839   LABSPEC 1.010 12/30/2013 1414   PHURINE 5.0 01/20/2022 1839   GLUCOSEU >=500 (A) 01/20/2022 1839   GLUCOSEU NEGATIVE 12/30/2013 1414   HGBUR NEGATIVE 01/20/2022 1839   BILIRUBINUR NEGATIVE 01/20/2022 1839   BILIRUBINUR NEGATIVE 12/30/2013 1414   KETONESUR NEGATIVE 01/20/2022 1839   PROTEINUR NEGATIVE 01/20/2022 1839   NITRITE NEGATIVE 01/20/2022 1839   LEUKOCYTESUR NEGATIVE 01/20/2022 1839   LEUKOCYTESUR NEGATIVE 12/30/2013 1414    Pertinent Imaging: Urine reviewed and sent for culture.  Chart reviewed  Assessment & Plan: Patient has high-volume mixed incontinence and high-volume bedwetting and frequency.  She will return for urodynamics and cystoscopy.  Call if culture positive  1. Urinary incontinence, unspecified type (Primary)  - Urinalysis, Complete  2. History of nephrolithiasis  - Urinalysis, Complete   No follow-ups on file.  Glendia DELENA Elizabeth, MD  [facility] [address] [place], [place] [address] [phone]

## 2024-03-11 NOTE — Addendum Note (Signed)
 Addended byBETHA CORIE PLATER on: 03/11/2024 04:56 PM   Modules accepted: Orders

## 2024-03-12 ENCOUNTER — Ambulatory Visit (INDEPENDENT_AMBULATORY_CARE_PROVIDER_SITE_OTHER)

## 2024-03-12 ENCOUNTER — Telehealth: Payer: Self-pay | Admitting: Internal Medicine

## 2024-03-12 DIAGNOSIS — E785 Hyperlipidemia, unspecified: Secondary | ICD-10-CM | POA: Diagnosis not present

## 2024-03-12 DIAGNOSIS — E1169 Type 2 diabetes mellitus with other specified complication: Secondary | ICD-10-CM | POA: Diagnosis not present

## 2024-03-12 DIAGNOSIS — E118 Type 2 diabetes mellitus with unspecified complications: Secondary | ICD-10-CM | POA: Diagnosis not present

## 2024-03-12 DIAGNOSIS — Z794 Long term (current) use of insulin: Secondary | ICD-10-CM | POA: Diagnosis not present

## 2024-03-12 LAB — POCT GLYCOSYLATED HEMOGLOBIN (HGB A1C): Hemoglobin A1C: 8.4 % — AB (ref 4.0–5.6)

## 2024-03-12 LAB — URINALYSIS, COMPLETE
Bilirubin, UA: NEGATIVE
Glucose, UA: NEGATIVE
Ketones, UA: NEGATIVE
Leukocytes,UA: NEGATIVE
Nitrite, UA: NEGATIVE
Protein,UA: NEGATIVE
RBC, UA: NEGATIVE
Specific Gravity, UA: 1.02 (ref 1.005–1.030)
Urobilinogen, Ur: 0.2 mg/dL (ref 0.2–1.0)
pH, UA: 6 (ref 5.0–7.5)

## 2024-03-12 LAB — MICROSCOPIC EXAMINATION

## 2024-03-12 MED ORDER — ATORVASTATIN CALCIUM 40 MG PO TABS: 40.0000 mg | ORAL_TABLET | Freq: Every day | ORAL | 1 refills | Status: AC

## 2024-03-12 NOTE — Patient Instructions (Addendum)
 Thanks for visiting with me!  Increase atorvastatin  to 40 mg daily Increase Rybelsus  to 7 mg daily Decrease Humalog : 10 units with breakfast and lunch 14 units with dinner Continue Tresiba 62 units daily Continue metformin 1000 mg twice daily Continue pioglitazone 15 mg daily   Call me if you have any questions! Honour Schwieger E. Marsh, PharmD, CPP Clinical Pharmacist [facility] [phone]

## 2024-03-12 NOTE — Telephone Encounter (Signed)
 Patient notified #[phone] with 3 refills sent to [facility] on 11/21/2023

## 2024-03-12 NOTE — Telephone Encounter (Signed)
sertraline (ZOLOFT) 100 MG tablet

## 2024-03-12 NOTE — Progress Notes (Signed)
 S:     Reason for visit: ?  Miranda Pollard is a 73 y.o. female with a history of diabetes (type 2), who presents today for a follow up diabetes Face to Face pharmacotherapy visit.? Pertinent PMH also includes HTN, obesity, GERD, HLD, urinary incontinence.  Care Team: Primary Care Provider: Gareth Clarity  At last visit with clinical pharmacist on 02/13/24, patient was instructed to decrease Humalog  from 16 to 14 units TID and start Rybelsus  3 mg daily.   Today, she reports she continued Humalog  16 units TID, but has been noticing hypoglycemic events following lunch and dinner.   Current diabetes medications include: Humalog  14 units TID, Tresiba 62 units daily, pioglitazone 15 mg daily, metformin 1000 mg BID, Rybelsus  3 mg daily  Previous diabetes medications include: Victoza & Ozempic (stomach pain) Current hypertension medications include: losartan  50 mg daily Current hyperlipidemia medications include: atorvastatin  10 mg daily  Patient reports adherence to taking all medications as prescribed.   Have you been experiencing any side effects to the medications prescribed? no Do you have any problems obtaining medications due to transportation or finances? no Insurance coverage: [facility] Medicare  Patient reports post-prandial hypoglycemic events following breakfast and lunch  DM Prevention:  Statin: Taking; moderate intensity.?  ACE/ARB: yes; losartan  History of chronic kidney disease? no Last urinary albumin/creatinine ratio:  Lab Results  Component Value Date   MICRALBCREAT NOTE 11/21/2023   Last eye exam:  Lab Results  Component Value Date   HMDIABEYEEXA No Retinopathy 10/17/2023   Lab Results  Component Value Date   HMDIABEYEEXA No Retinopathy 10/17/2023   Last foot exam: 12/22/2023 Tobacco Use:  Tobacco Use: Low Risk  (02/14/2024)   Patient History    Smoking Tobacco Use: Never    Smokeless Tobacco Use: Never    Passive Exposure: Not on file   O:  LibreView  Report Unable to upload reader d/t reader error 7 day report: Time in range: 45% Time above range: 55% Time below range: 0%  Average BG: 188 mg/dL 87jf - 6am: 806 mg/dL 6am - 87ef: 825 mg/dL 87ef - 6pm: 816 mg/dL 6pm - 87jf: 795 mg/dL   Vitals:  Wt Readings from Last 3 Encounters:  03/11/24 249 lb (112.9 kg)  02/14/24 249 lb (112.9 kg)  12/22/23 250 lb (113.4 kg)   BP Readings from Last 3 Encounters:  03/11/24 (!) 149/72  02/14/24 130/78  12/22/23 120/78   Pulse Readings from Last 3 Encounters:  03/11/24 100  02/14/24 95  12/22/23 89     Labs:?  Lab Results  Component Value Date   HGBA1C 8.7 (H) 11/21/2023   HGBA1C 9.8 09/07/2023   GLUCOSE 158 (H) 11/21/2023   MICRALBCREAT NOTE 11/21/2023   CREATININE 0.60 11/21/2023   CREATININE 0.92 01/20/2022   CREATININE 0.54 10/31/2017    Lab Results  Component Value Date   CHOL 163 11/21/2023   LDLCALC 93 11/21/2023   HDL 53 11/21/2023   TRIG 79 11/21/2023   ALT 24 11/21/2023   ALT 30 01/20/2022   AST 22 11/21/2023   AST 37 01/20/2022      Chemistry      Component Value Date/Time   NA 138 11/21/2023 1038   NA 139 12/30/2013 1415   K 5.0 11/21/2023 1038   K 3.7 12/30/2013 1415   CL 99 11/21/2023 1038   CL 99 12/30/2013 1415   CO2 31 11/21/2023 1038   CO2 31 12/30/2013 1415   BUN 14 11/21/2023 1038  BUN 9 12/30/2013 1415   CREATININE 0.60 11/21/2023 1038      Component Value Date/Time   CALCIUM  9.6 11/21/2023 1038   CALCIUM  9.6 12/30/2013 1415   ALKPHOS 74 01/20/2022 1839   ALKPHOS 55 12/30/2013 1415   AST 22 11/21/2023 1038   AST 43 (H) 12/30/2013 1415   ALT 24 11/21/2023 1038   ALT 58 12/30/2013 1415   BILITOT 0.5 11/21/2023 1038   BILITOT 0.3 12/30/2013 1415       The 10-year ASCVD risk score (Arnett DK, et al., 2019) is: 28.5%  Lab Results  Component Value Date   MICRALBCREAT NOTE 11/21/2023    A/P: Diabetes currently uncontrolled with a most recent A1c of 8.4% on 03/12/24, which is  down from 8.7% on 11/21/23. Patient is able to verbalize appropriate hypoglycemia management plan. Medication adherence appears optimal. Unable to upload reader today after about 15 minutes attempting to do so. Patient has new reader at home to begin using as it appears that reader might be old. Reported hypoglycemia following breakfast and lunch. Will decrease dose of short-acting insulin  given incidences of hypoglycemia. Patient is tolerating Rybelsus  without issue. Will increase dose at this time.  -Continued basal insulin  Tresiba (insulin  degludec)  62 units daily.  -Decreased dose of rapid insulin  Humalog  (insulin  lispro) to 10 units with breakfast and lunch and 14 units with dinner -Increased dose of GLP-1 Rybelsus  (semaglutide ) 7 mg daily -Continued metformin 1000 mg BID. -Continued pioglitazone 15 mg daily  -Patient educated on purpose, proper use, and potential adverse effects of Rybelsus .  -Extensively discussed pathophysiology of diabetes, recommended lifestyle interventions, dietary effects on blood sugar control.  -Counseled on s/sx of and management of hypoglycemia.  -Next A1c anticipated 02/2024.   ASCVD risk - primary prevention in patient with diabetes. Last LDL is 93 mg/dL, not at goal of <29 mg/dL. High intensity statin indicated.  The 10-year ASCVD risk score (Arnett DK, et al., 2019) is: 28.5%   Values used to calculate the score:     Age: 19 years     Clincally relevant sex: Female     Is Non-Hispanic African American: No     Diabetic: Yes     Tobacco smoker: No     Systolic Blood Pressure: 149 mmHg     Is BP treated: Yes     HDL Cholesterol: 53 mg/dL     Total Cholesterol: 163 mg/dL -Increased dose of atorvastatin  40 mg daily.   Patient verbalized understanding of treatment plan. Total time patient counseling 30 minutes.  Follow-up:  Pharmacist on 04/16/24 PCP clinic visit on 05/23/24  Peyton CHARLENA Ferries, PharmD, CPP Clinical Pharmacist [facility] [phone]

## 2024-04-12 ENCOUNTER — Other Ambulatory Visit: Payer: Self-pay

## 2024-04-12 ENCOUNTER — Telehealth: Payer: Self-pay | Admitting: Family Medicine

## 2024-04-12 MED ORDER — LOSARTAN POTASSIUM 50 MG PO TABS: 50.0000 mg | ORAL_TABLET | Freq: Every day | ORAL | 0 refills | Status: AC

## 2024-04-12 NOTE — Telephone Encounter (Signed)
 Sent!

## 2024-04-12 NOTE — Telephone Encounter (Signed)
losartan (COZAAR) 50 MG tablet °

## 2024-04-16 ENCOUNTER — Ambulatory Visit

## 2024-04-16 VITALS — Wt 242.9 lb

## 2024-04-16 DIAGNOSIS — E118 Type 2 diabetes mellitus with unspecified complications: Secondary | ICD-10-CM

## 2024-04-16 DIAGNOSIS — Z794 Long term (current) use of insulin: Secondary | ICD-10-CM | POA: Diagnosis not present

## 2024-04-16 MED ORDER — RYBELSUS 14 MG PO TABS: 14.0000 mg | ORAL_TABLET | Freq: Every day | ORAL | 1 refills | Status: AC

## 2024-04-16 NOTE — Progress Notes (Signed)
 "  S:     Reason for visit: ?  Miranda Pollard is a 74 y.o. female with a history of diabetes (type 2), who presents today for a follow up diabetes Face to Face pharmacotherapy visit.? Pertinent PMH also includes HTN, obesity, GERD, HLD, urinary incontinence.  Care Team: Primary Care Provider: Gareth Clarity  They were referred to the pharmacist by their PCP for assistance in managing diabetes.  At last visit with clinical pharmacist on 03/12/24, Rybelsus  was increased to 7 mg and Humalog  was decreased to 10 units with breakfast and lunch and 14 units with dinner. Atorvastatin  was increased from 10 mg to 40 mg daily.   Today, she reports BG has been a bit higher than last visit with the holiday season and her birthday. She states that the new insulin  regimen has resolved her instances of hypoglycemia. She received a new Freestyle Libre meter, but was unable to set this up yet.   Current diabetes medications include: Humalog  10 units with breakfast and lunch and 14 units with dinner, Tresiba 62 units daily, pioglitazone 15 mg daily, metformin 1000 mg BID, Rybelsus  7 mg daily  Previous diabetes medications include: Victoza & Ozempic (stomach pain) Current hypertension medications include: losartan  50 mg daily Current hyperlipidemia medications include: atorvastatin  40 mg daily  Patient reports adherence to taking all medications as prescribed.   Have you been experiencing any side effects to the medications prescribed? no Do you have any problems obtaining medications due to transportation or finances? no Insurance coverage: [facility] Medicare  Patient reports post-prandial hypoglycemic events following breakfast and lunch  DM Prevention:  Statin: Taking; moderate intensity.?  ACE/ARB: yes; losartan  History of chronic kidney disease? no Last urinary albumin/creatinine ratio:  Lab Results  Component Value Date   MICRALBCREAT NOTE 11/21/2023   Last eye exam:  Lab Results  Component Value  Date   HMDIABEYEEXA No Retinopathy 10/17/2023   Lab Results  Component Value Date   HMDIABEYEEXA No Retinopathy 10/17/2023   Last foot exam: 12/22/2023 Tobacco Use:  Tobacco Use: Low Risk (02/14/2024)   Patient History    Smoking Tobacco Use: Never    Smokeless Tobacco Use: Never    Passive Exposure: Not on file   O:  LibreView Report Unable to upload reader d/t reader error 7 day report: Time in range: 36% Time above range: 64% Time below range: 0%  Average BG: 198 mg/dL 87jf - 6am: 796 mg/dL 6am - 87ef: 800 mg/dL 87ef - 6pm: 821 mg/dL 6pm - 87jf: 786 mg/dL  Vitals:  Wt Readings from Last 3 Encounters:  03/11/24 249 lb (112.9 kg)  02/14/24 249 lb (112.9 kg)  12/22/23 250 lb (113.4 kg)   BP Readings from Last 3 Encounters:  03/11/24 (!) 149/72  02/14/24 130/78  12/22/23 120/78   Pulse Readings from Last 3 Encounters:  03/11/24 100  02/14/24 95  12/22/23 89     Labs:?  Lab Results  Component Value Date   HGBA1C 8.4 (A) 03/12/2024   HGBA1C 8.7 (H) 11/21/2023   HGBA1C 9.8 09/07/2023   GLUCOSE 158 (H) 11/21/2023   MICRALBCREAT NOTE 11/21/2023   CREATININE 0.60 11/21/2023   CREATININE 0.92 01/20/2022   CREATININE 0.54 10/31/2017    Lab Results  Component Value Date   CHOL 163 11/21/2023   LDLCALC 93 11/21/2023   HDL 53 11/21/2023   TRIG 79 11/21/2023   ALT 24 11/21/2023   ALT 30 01/20/2022   AST 22 11/21/2023   AST 37 01/20/2022  Chemistry      Component Value Date/Time   NA 138 11/21/2023 1038   NA 139 12/30/2013 1415   K 5.0 11/21/2023 1038   K 3.7 12/30/2013 1415   CL 99 11/21/2023 1038   CL 99 12/30/2013 1415   CO2 31 11/21/2023 1038   CO2 31 12/30/2013 1415   BUN 14 11/21/2023 1038   BUN 9 12/30/2013 1415   CREATININE 0.60 11/21/2023 1038      Component Value Date/Time   CALCIUM  9.6 11/21/2023 1038   CALCIUM  9.6 12/30/2013 1415   ALKPHOS 74 01/20/2022 1839   ALKPHOS 55 12/30/2013 1415   AST 22 11/21/2023 1038   AST 43 (H)  12/30/2013 1415   ALT 24 11/21/2023 1038   ALT 58 12/30/2013 1415   BILITOT 0.5 11/21/2023 1038   BILITOT 0.3 12/30/2013 1415       The 10-year ASCVD risk score (Arnett DK, et al., 2019) is: 31.4%  Lab Results  Component Value Date   MICRALBCREAT NOTE 11/21/2023    A/P: Diabetes currently uncontrolled with a most recent A1c of 8.4% on 03/12/24, which is down from 8.7% on 11/21/23. Patient is able to verbalize appropriate hypoglycemia management plan. Medication adherence appears optimal. Unable to upload reader today after about 15 minutes attempting to do so. Patient has new reader at home to begin using as the old reader is malfunctioning and will not allow for a report to be generated. Reported hypoglycemia has resolved. BG was slightly higher than last visit with an average of 198 mg/dL. Patient is tolerating Rybelsus  without issue. Will increase dose at this time.  -Continued basal insulin  Tresiba (insulin  degludec)  62 units daily.  -Continued rapid insulin  Humalog  (insulin  lispro) to 10 units with breakfast and lunch and 14 units with dinner -Increased dose of GLP-1 Rybelsus  (semaglutide ) 14 mg daily -Continued metformin 1000 mg BID. -Continued pioglitazone 15 mg daily  -Patient educated on purpose, proper use, and potential adverse effects of Rybelsus .  -Extensively discussed pathophysiology of diabetes, recommended lifestyle interventions, dietary effects on blood sugar control.  -Counseled on s/sx of and management of hypoglycemia.  -Next A1c anticipated 02/2024.   ASCVD risk - primary prevention in patient with diabetes. Last LDL is 93 mg/dL, not at goal of <29 mg/dL. High intensity statin indicated.  -Continued atorvastatin  40 mg daily.   Patient verbalized understanding of treatment plan. Total time patient counseling 30 minutes.  Follow-up:  Pharmacist on 05/21/24 PCP clinic visit on 05/23/24 Endocrinology on 06/19/24  Peyton CHARLENA Ferries, PharmD, CPP Clinical  Pharmacist [facility] [phone]   "

## 2024-04-18 ENCOUNTER — Telehealth: Payer: Self-pay

## 2024-04-18 NOTE — Telephone Encounter (Signed)
 Copied from CRM [id number]. Topic: Clinical - Medication Question >> Apr 18, 2024  3:05 PM Donna BRAVO wrote: Reason for CRM: Patient asking to speak with   Allyson E. Marsh, PharmD, CPP Clinical Pharmacist [facility] [phone]  Taking a higher dosage Semaglutide  (RYBELSUS ) 14 MG TABS  is costing $837.45 to get 90 day supply    Please have Peyton CHARLENA Ferries, PharmD, CPP call the patient back  [phone]

## 2024-04-19 NOTE — Progress Notes (Signed)
" ° °  04/19/2024  Miranda Pollard  DOB: March 07, 1951 MRN: [id number]  Attempted to contact patient regarding cost of Rybelsus . Left HIPAA compliant message for patient to return my call at their convenience.   Naesha Buckalew E. Marsh, PharmD Clinical Pharmacist [facility] [phone]  "

## 2024-04-19 NOTE — Telephone Encounter (Signed)
 Brief Telephone Documentation Reason for Call: cost of Rybelsus   Summary of Call: It appears patient had likely met deductible for 2025 when initially starting Rybelsus . It also appears she might be eligible for Medicare Extra Help based on reported income. Explained required documents needed for application completion  Follow Up: Pharmacist on 04/26/24 for LIS enrollment  Delfina Schreurs E. Marsh, PharmD Clinical Pharmacist [facility] [phone]

## 2024-04-26 ENCOUNTER — Other Ambulatory Visit: Payer: Self-pay

## 2024-04-26 DIAGNOSIS — E118 Type 2 diabetes mellitus with unspecified complications: Secondary | ICD-10-CM

## 2024-04-26 NOTE — Addendum Note (Signed)
 Addended by: MARSH, Skye Rodarte E on: 04/26/2024 03:12 PM   Modules accepted: Level of Service

## 2024-04-26 NOTE — Progress Notes (Signed)
" ° °  04/26/2024  Miranda Pollard  DOB: [birth year] MRN: [id number]  Contacted patient to complete Medicare Extra Help application. It appears that resources exceed limit for LIS.   Patient plans to discuss Medicare Payment Plan for insurance.   Lovinia Snare E. Marsh, PharmD Clinical Pharmacist [facility] [phone]  "

## 2024-05-03 ENCOUNTER — Telehealth: Payer: Self-pay

## 2024-05-03 NOTE — Telephone Encounter (Signed)
 Brief Telephone Documentation Reason for Call: Patient left message regarding question for pharmacist   Summary of Call: Patient reported BG after breakfast today of 74 mg/dL. She reports administering Humalog  10 units before eating a bowl of oatmeal. Patient reports BG is typically around ~160-180 mg/dL following breakfast. Recommended patient continue current Humalog  dosing regimen and begin logging meals until follow up.   Patient confirmed she has stock of hypoglycemic snacks, if needed.   Follow Up: Patient given direct line for further questions/concerns.  Brazos Sandoval E. Marsh, PharmD, BCACP, CPP Clinical Pharmacist [facility] [phone]

## 2024-05-09 ENCOUNTER — Ambulatory Visit
Admission: RE | Admit: 2024-05-09 | Discharge: 2024-05-09 | Disposition: A | Discharge status: Home / Self Care | Attending: Family Medicine | Admitting: Family Medicine

## 2024-05-09 ENCOUNTER — Ambulatory Visit: Payer: Self-pay

## 2024-05-09 ENCOUNTER — Encounter: Payer: Self-pay | Admitting: Family Medicine

## 2024-05-09 ENCOUNTER — Ambulatory Visit
Admission: RE | Admit: 2024-05-09 | Discharge: 2024-05-09 | Disposition: A | Discharge status: Home / Self Care | Source: Ambulatory Visit | Attending: Family Medicine | Admitting: Family Medicine

## 2024-05-09 ENCOUNTER — Ambulatory Visit: Admitting: Family Medicine

## 2024-05-09 VITALS — BP 136/84 | HR 70 | Resp 16 | Ht 64.0 in | Wt 238.0 lb

## 2024-05-09 DIAGNOSIS — K219 Gastro-esophageal reflux disease without esophagitis: Secondary | ICD-10-CM

## 2024-05-09 DIAGNOSIS — R140 Abdominal distension (gaseous): Secondary | ICD-10-CM | POA: Diagnosis not present

## 2024-05-09 DIAGNOSIS — Z794 Long term (current) use of insulin: Secondary | ICD-10-CM | POA: Diagnosis not present

## 2024-05-09 DIAGNOSIS — R1010 Upper abdominal pain, unspecified: Secondary | ICD-10-CM

## 2024-05-09 DIAGNOSIS — E1169 Type 2 diabetes mellitus with other specified complication: Secondary | ICD-10-CM | POA: Diagnosis not present

## 2024-05-09 DIAGNOSIS — R110 Nausea: Secondary | ICD-10-CM

## 2024-05-09 DIAGNOSIS — E785 Hyperlipidemia, unspecified: Secondary | ICD-10-CM | POA: Diagnosis not present

## 2024-05-09 NOTE — Telephone Encounter (Signed)
 Abdominal discomfort, bloating, belching, nausea without vomiting, history of abdominal hernia, appt for today scheduled.   FYI Only or Action Required?: FYI only for provider: appointment scheduled on 1/29.  Patient was last seen in primary care on 02/14/2024 by Gareth Mliss FALCON, FNP.  Called Nurse Triage reporting Abdominal Pain.  Symptoms began a week ago.  Interventions attempted: Nothing.  Symptoms are: gradually worsening.  Triage Disposition: See HCP Within 4 Hours (Or PCP Triage)  Patient/caregiver understands and will follow disposition?: Yes    Reason for Triage:  Patient has a hernia and she is experiencing..  Abdominal pain/discomfort, difficulty eating, nausea,  diaarhea yesterday.    Reason for Disposition  [1] Vomiting AND [2] abdomen looks much more swollen than usual  Answer Assessment - Initial Assessment Questions 1. LOCATION: Where does it hurt?      Just above my belly button, across the upper part of my stomach    3. ONSET: When did the pain begin? (e.g., minutes, hours or days ago)      Last week   4. SUDDEN: Gradual or sudden onset?     Gradual  5. PATTERN Does the pain come and go, or is it constant?     Comes and goes  6. SEVERITY: How bad is the pain?  (e.g., Scale 1-10; mild, moderate, or severe)     It's not really pain, just discomfort. Just don't feel good.   7. RECURRENT SYMPTOM: Have you ever had this type of stomach pain before? If Yes, ask: When was the last time? and What happened that time?      Denies  8. CAUSE: What do you think is causing the stomach pain? (e.g., gallstones, recent abdominal surgery)     Pt unsure if her abdominal hernia is the cause  9. RELIEVING/AGGRAVATING FACTORS: What makes it better or worse? (e.g., antacids, bending or twisting motion, bowel movement)     Passing gas relieves the pain; eating worsens the pain  10. OTHER SYMPTOMS: Do you have any other symptoms? (e.g., back  pain, diarrhea, fever, urination pain, vomiting)       Poor appetite, 1 bout of diarrhea yesterday which has since resolved, nausea without vomiting, acid reflux, belching, bloated  Hx abdominal hernia, DM 2, GERD  Protocols used: Abdominal Pain - Female-A-AH

## 2024-05-09 NOTE — Assessment & Plan Note (Addendum)
 GERD previously controlled, however she has been having more heartburn and indigestion in the last one week.   -Increase Omeprazole  to 40mg  daily -Return in 1 month for f/u

## 2024-05-09 NOTE — Assessment & Plan Note (Addendum)
 Hyperlipidemia controlled, last LDL 93 in 11/2023. Type 2 DM uncontrolled, last A1c 8.4 in 03/2024.   Discussed suspicion that Rybelsus  could be contributing to GI symptoms as described above. Will not make changes to diabetic regimen at this time. She was advised to continue all diabetic medications as prescribed. She was also advised to maintain endocrinology follow up.

## 2024-05-09 NOTE — Assessment & Plan Note (Deleted)
 Miranda Pollard

## 2024-05-09 NOTE — Progress Notes (Signed)
 "  Acute Office Visit  Subjective:     Patient ID: Miranda Pollard, female    DOB: 1950-11-14, 74 y.o.   MRN: [id number]  Chief Complaint  Patient presents with   Abdominal Pain    B/L upper abdomen X1 week, worsened yesterday   Nausea    HPI Patient is in today for complaints of upper abdominal pain. She is a new patient to me, though established in office. She voices upper abdominal pain started one week ago. She does endorse associated nausea and diarrhea. She voices she has had two episodes of diarrhea, reporting last episode of diarrhea yesterday. Denies vomiting. She voices yesterday she bent over to pick something that her cat knocked off and she describes regurgitation of food that she just ate. She describes associated heartburn and indigestion. She also reports increased belching describing it to be awful. She had been constipated previously one week ago but has had normal, soft, formed stool since then.   She takes Omeprazole  for acid reflux and voices until this last one week it had been helping. Discussed increasing Omeprazole  from 20mg  daily to 40mg  daily. She is receptive. She denies prescription voicing she gets medication OTC and will begin taking two capsules daily to make a total dose of 40mg .   CRC screening not UTD, which she also confirms she is past due. She believes her last colonoscopy was at least 10 years ago. Will refer to GI.    Type 2 DM uncontrolled, last A1c 8.4. She does follow with endocrinology. She voices endo prescribes Humalog , Tresiba, Actos and Metformin. She voices she is working with the pharmacist here in the office as well for diabetes management. She is prescribed Rybelsus  14mg  daily and voices this has been prescribed in this office when working with the pharmacist. She voices she has not increased to the 14mg  due to cost, and continues to take 7mg  daily. She voices she has been on the 7mg  Rybelsus  starting in the last one to two months.    Review of Systems  Constitutional:  Negative for fever.  Respiratory:  Negative for shortness of breath.   Cardiovascular:  Negative for chest pain.  Gastrointestinal:  Positive for abdominal pain, diarrhea, heartburn and nausea. Negative for blood in stool and vomiting.        Objective:    BP 136/84   Pulse 70   Resp 16   Ht 5' 4 (1.626 m)   Wt 238 lb (108 kg)   SpO2 96%   BMI 40.85 kg/m  BP Readings from Last 3 Encounters:  05/09/24 136/84  03/11/24 (!) 149/72  02/14/24 130/78   Wt Readings from Last 3 Encounters:  05/09/24 238 lb (108 kg)  04/16/24 242 lb 14.4 oz (110.2 kg)  03/11/24 249 lb (112.9 kg)   SpO2 Readings from Last 3 Encounters:  05/09/24 96%  02/14/24 96%  12/22/23 95%      Last CBC Lab Results  Component Value Date   WBC 8.1 11/21/2023   HGB 14.5 11/21/2023   HCT 45.9 (H) 11/21/2023   MCV 89.8 11/21/2023   MCH 28.4 11/21/2023   RDW 13.3 11/21/2023   PLT 299 11/21/2023   Last metabolic panel Lab Results  Component Value Date   GLUCOSE 158 (H) 11/21/2023   NA 138 11/21/2023   K 5.0 11/21/2023   CL 99 11/21/2023   CO2 31 11/21/2023   BUN 14 11/21/2023   CREATININE 0.60 11/21/2023   EGFR 97 11/21/2023  CALCIUM  9.6 11/21/2023   PROT 7.0 11/21/2023   ALBUMIN 3.8 01/20/2022   BILITOT 0.5 11/21/2023   ALKPHOS 74 01/20/2022   AST 22 11/21/2023   ALT 24 11/21/2023   ANIONGAP 11 01/20/2022   Last lipids Lab Results  Component Value Date   CHOL 163 11/21/2023   HDL 53 11/21/2023   LDLCALC 93 11/21/2023   TRIG 79 11/21/2023   CHOLHDL 3.1 11/21/2023   Last hemoglobin A1c Lab Results  Component Value Date   HGBA1C 8.4 (A) 03/12/2024   Last thyroid  functions Lab Results  Component Value Date   TSH 0.516 10/31/2017      Physical Exam Constitutional:      Appearance: She is obese. She is not toxic-appearing.  Cardiovascular:     Rate and Rhythm: Normal rate and regular rhythm.     Heart sounds: Normal heart sounds.   Pulmonary:     Effort: Pulmonary effort is normal.     Breath sounds: Normal breath sounds.  Abdominal:     General: Bowel sounds are normal. There is distension.     Palpations: Abdomen is soft.     Tenderness: There is abdominal tenderness in the right upper quadrant, left upper quadrant and left lower quadrant.     Comments: Right lower quadrant non-tender to palpation.   Musculoskeletal:     Right lower leg: No edema.     Left lower leg: No edema.  Skin:    General: Skin is warm and dry.  Neurological:     Mental Status: She is alert and oriented to person, place, and time.  Psychiatric:        Mood and Affect: Mood normal.        Behavior: Behavior normal.        Assessment & Plan:   Assessment & Plan Pain of upper abdomen Upper abdominal pain x 1 week. See HPI for details.   -Recommended BRAT diet. -Labs ordered -STAT KUB abdomen ordered -Referral to GI placed  -Return in 1 month for f/u  Orders:   CBC w/Diff/Platelet   Comprehensive Metabolic Panel (CMET)   Amylase   Lipase   DG Abd 1 View; Future   Ambulatory referral to Gastroenterology  Bloating Upper abdominal pain with associated bloating x 1 week. See HPI for details.   -Recommended BRAT diet. Recommended avoiding carbonated beverages.  -Labs ordered -STAT KUB abdomen ordered -Referral to GI placed -Return in 1 month for f/u  Orders:   CBC w/Diff/Platelet   Comprehensive Metabolic Panel (CMET)   Amylase   Lipase   Ambulatory referral to Gastroenterology  Nausea Nausea associated with abdominal pain x 1 week. See HPI for details. Suspect nausea is s/t uncontrolled GERD.   -Increase Omeprazole  to 40mg  daily, denies refill.  -Labs ordered -STAT KUB abdomen ordered -Referral to GI placed -Return in 1 month for f/u  Orders:   CBC w/Diff/Platelet   Comprehensive Metabolic Panel (CMET)   Amylase   Lipase   DG Abd 1 View; Future   Ambulatory referral to Gastroenterology  Gastroesophageal  reflux disease, unspecified whether esophagitis present GERD previously controlled, however she has been having more heartburn and indigestion in the last one week.   -Increase Omeprazole  to 40mg  daily -Return in 1 month for f/u     Hyperlipidemia due to type 2 diabetes mellitus (HCC) Hyperlipidemia controlled, last LDL 93 in 11/2023. Type 2 DM uncontrolled, last A1c 8.4 in 03/2024.   Discussed suspicion that Rybelsus  could be contributing to  GI symptoms as described above. Will not make changes to diabetic regimen at this time. She was advised to continue all diabetic medications as prescribed. She was also advised to maintain endocrinology follow up.       Return in about 1 month (around 06/08/2024).  LAYMON LOISE CORE, FNP  "

## 2024-05-10 ENCOUNTER — Ambulatory Visit: Payer: Self-pay | Admitting: Family Medicine

## 2024-05-10 LAB — COMPREHENSIVE METABOLIC PANEL WITH GFR
AG Ratio: 1.2 (calc) (ref 1.0–2.5)
ALT: 24 U/L (ref 6–29)
AST: 18 U/L (ref 10–35)
Albumin: 3.9 g/dL (ref 3.6–5.1)
Alkaline phosphatase (APISO): 66 U/L (ref 37–153)
BUN/Creatinine Ratio: 26 (calc) — ABNORMAL HIGH (ref 6–22)
BUN: 14 mg/dL (ref 7–25)
CO2: 26 mmol/L (ref 20–32)
Calcium: 9.2 mg/dL (ref 8.6–10.4)
Chloride: 101 mmol/L (ref 98–110)
Creat: 0.53 mg/dL — ABNORMAL LOW (ref 0.60–1.00)
Globulin: 3.2 g/dL (ref 1.9–3.7)
Glucose, Bld: 192 mg/dL — ABNORMAL HIGH (ref 65–99)
Potassium: 4.3 mmol/L (ref 3.5–5.3)
Sodium: 136 mmol/L (ref 135–146)
Total Bilirubin: 0.4 mg/dL (ref 0.2–1.2)
Total Protein: 7.1 g/dL (ref 6.1–8.1)
eGFR: 99 mL/min/{1.73_m2}

## 2024-05-10 LAB — CBC WITH DIFFERENTIAL/PLATELET
Absolute Lymphocytes: 3273 {cells}/uL (ref 850–3900)
Absolute Monocytes: 755 {cells}/uL (ref 200–950)
Basophils Absolute: 69 {cells}/uL (ref 0–200)
Basophils Relative: 0.7 %
Eosinophils Absolute: 137 {cells}/uL (ref 15–500)
Eosinophils Relative: 1.4 %
HCT: 43.2 % (ref 35.9–46.0)
Hemoglobin: 14.3 g/dL (ref 11.7–15.5)
MCH: 27.6 pg (ref 27.0–33.0)
MCHC: 33.1 g/dL (ref 31.6–35.4)
MCV: 83.2 fL (ref 81.4–101.7)
MPV: 10.2 fL (ref 7.5–12.5)
Monocytes Relative: 7.7 %
Neutro Abs: 5566 {cells}/uL (ref 1500–7800)
Neutrophils Relative %: 56.8 %
Platelets: 300 10*3/uL (ref 140–400)
RBC: 5.19 Million/uL — ABNORMAL HIGH (ref 3.80–5.10)
RDW: 13.3 % (ref 11.0–15.0)
Total Lymphocyte: 33.4 %
WBC: 9.8 10*3/uL (ref 3.8–10.8)

## 2024-05-10 LAB — LIPASE: Lipase: 24 U/L (ref 7–60)

## 2024-05-10 LAB — AMYLASE: Amylase: 19 U/L — ABNORMAL LOW (ref 21–101)

## 2024-05-21 ENCOUNTER — Ambulatory Visit

## 2024-05-23 ENCOUNTER — Ambulatory Visit: Admitting: Nurse Practitioner

## 2024-05-27 ENCOUNTER — Other Ambulatory Visit: Admitting: Urology
